# Patient Record
Sex: Female | Born: 1990
Health system: Southern US, Community
[De-identification: ages and names within clinical notes are randomized; demographics above are authoritative.]

## PROBLEM LIST (undated history)

## (undated) ENCOUNTER — Inpatient Hospital Stay: Payer: Self-pay

## (undated) DIAGNOSIS — O039 Complete or unspecified spontaneous abortion without complication: Secondary | ICD-10-CM

## (undated) DIAGNOSIS — I10 Essential (primary) hypertension: Secondary | ICD-10-CM

## (undated) DIAGNOSIS — D573 Sickle-cell trait: Secondary | ICD-10-CM

## (undated) HISTORY — DX: Sickle-cell trait: D57.3

## (undated) HISTORY — PX: FINGER SURGERY: SHX640

---
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
## 2009-05-12 ENCOUNTER — Ambulatory Visit: Payer: Self-pay | Admitting: Internal Medicine

## 2009-05-25 ENCOUNTER — Ambulatory Visit: Payer: Self-pay | Admitting: Orthopedic Surgery

## 2009-05-26 ENCOUNTER — Ambulatory Visit: Payer: Self-pay | Admitting: Orthopedic Surgery

## 2010-08-10 LAB — HM PAP SMEAR: HM Pap smear: NEGATIVE

## 2010-11-01 ENCOUNTER — Emergency Department: Payer: Self-pay | Admitting: Unknown Physician Specialty

## 2010-11-04 ENCOUNTER — Emergency Department: Payer: Self-pay | Admitting: Emergency Medicine

## 2011-08-07 ENCOUNTER — Emergency Department: Payer: Self-pay | Admitting: Emergency Medicine

## 2011-08-07 LAB — URINALYSIS, COMPLETE
Bilirubin,UR: NEGATIVE
Blood: NEGATIVE
Glucose,UR: NEGATIVE mg/dL (ref 0–75)
Ketone: NEGATIVE
Nitrite: NEGATIVE
Ph: 6 (ref 4.5–8.0)
Protein: NEGATIVE
RBC,UR: 1 /HPF (ref 0–5)
Specific Gravity: 1.019 (ref 1.003–1.030)
Squamous Epithelial: 4
WBC UR: 9 /HPF (ref 0–5)

## 2011-08-07 LAB — PREGNANCY, URINE: Pregnancy Test, Urine: NEGATIVE m[IU]/mL

## 2011-08-30 ENCOUNTER — Ambulatory Visit: Payer: Self-pay

## 2011-08-30 LAB — URINALYSIS, COMPLETE
Bilirubin,UR: NEGATIVE
Blood: NEGATIVE
Nitrite: POSITIVE
Ph: 5 (ref 4.5–8.0)

## 2011-08-30 LAB — OCCULT BLOOD X 1 CARD TO LAB, STOOL: Occult Blood, Feces: NEGATIVE

## 2011-08-30 LAB — PREGNANCY, URINE: Pregnancy Test, Urine: NEGATIVE m[IU]/mL

## 2013-07-24 ENCOUNTER — Emergency Department: Payer: Self-pay | Admitting: Emergency Medicine

## 2013-07-24 LAB — COMPREHENSIVE METABOLIC PANEL
ALBUMIN: 3.5 g/dL (ref 3.4–5.0)
ALT: 28 U/L (ref 12–78)
ANION GAP: 4 — AB (ref 7–16)
Alkaline Phosphatase: 80 U/L
BUN: 9 mg/dL (ref 7–18)
Bilirubin,Total: 0.3 mg/dL (ref 0.2–1.0)
CO2: 27 mmol/L (ref 21–32)
Calcium, Total: 8.5 mg/dL (ref 8.5–10.1)
Chloride: 107 mmol/L (ref 98–107)
Creatinine: 0.98 mg/dL (ref 0.60–1.30)
EGFR (African American): >60
EGFR (Non-African Amer.): >60
GLUCOSE: 82 mg/dL (ref 65–99)
OSMOLALITY: 273 (ref 275–301)
POTASSIUM: 3.7 mmol/L (ref 3.5–5.1)
SGOT(AST): 23 U/L (ref 15–37)
SODIUM: 138 mmol/L (ref 136–145)
TOTAL PROTEIN: 7.9 g/dL (ref 6.4–8.2)

## 2013-07-24 LAB — URINALYSIS, COMPLETE
BILIRUBIN, UR: NEGATIVE
Bacteria: NONE SEEN
Blood: NEGATIVE
Glucose,UR: NEGATIVE mg/dL (ref 0–75)
KETONE: NEGATIVE
Nitrite: NEGATIVE
PH: 6 (ref 4.5–8.0)
Protein: NEGATIVE
RBC,UR: 2 /HPF (ref 0–5)
Specific Gravity: 1.015 (ref 1.003–1.030)
Squamous Epithelial: 8
WBC UR: 30 /HPF (ref 0–5)

## 2013-07-24 LAB — CBC WITH DIFFERENTIAL/PLATELET
BASOS PCT: 0.9 %
Basophil #: 0.1 10*3/uL (ref 0.0–0.1)
EOS PCT: 3.3 %
Eosinophil #: 0.3 10*3/uL (ref 0.0–0.7)
HCT: 38.5 % (ref 35.0–47.0)
HGB: 12.4 g/dL (ref 12.0–16.0)
LYMPHS ABS: 2.6 10*3/uL (ref 1.0–3.6)
LYMPHS PCT: 33.8 %
MCH: 25.3 pg — ABNORMAL LOW (ref 26.0–34.0)
MCHC: 32.2 g/dL (ref 32.0–36.0)
MCV: 79 fL — AB (ref 80–100)
MONO ABS: 0.8 x10 3/mm (ref 0.2–0.9)
Monocyte %: 10.6 %
NEUTROS PCT: 51.4 %
Neutrophil #: 4 10*3/uL (ref 1.4–6.5)
PLATELETS: 301 10*3/uL (ref 150–440)
RBC: 4.9 10*6/uL (ref 3.80–5.20)
RDW: 14.7 % — ABNORMAL HIGH (ref 11.5–14.5)
WBC: 7.8 10*3/uL (ref 3.6–11.0)

## 2013-07-24 LAB — PREGNANCY, URINE: PREGNANCY TEST, URINE: NEGATIVE m[IU]/mL

## 2014-05-30 ENCOUNTER — Emergency Department: Payer: Self-pay | Admitting: Emergency Medicine

## 2016-02-15 ENCOUNTER — Encounter: Payer: Self-pay | Admitting: Emergency Medicine

## 2016-02-15 DIAGNOSIS — H579 Unspecified disorder of eye and adnexa: Secondary | ICD-10-CM | POA: Insufficient documentation

## 2016-02-15 DIAGNOSIS — Z5321 Procedure and treatment not carried out due to patient leaving prior to being seen by health care provider: Secondary | ICD-10-CM | POA: Insufficient documentation

## 2016-02-15 NOTE — ED Notes (Signed)
Visual acuity 20/40 left eye and 20/25 right eye

## 2016-02-15 NOTE — ED Triage Notes (Signed)
Patient ambulatory to triage with steady gait, without difficulty or distress noted; pt reports awoke with left eye irritation

## 2016-02-16 ENCOUNTER — Emergency Department
Admission: EM | Admit: 2016-02-16 | Discharge: 2016-02-16 | Disposition: A | Discharge status: Home / Self Care | Payer: Medicaid Other | Attending: Emergency Medicine | Admitting: Emergency Medicine

## 2016-05-29 ENCOUNTER — Encounter: Payer: Self-pay | Admitting: Emergency Medicine

## 2016-05-29 ENCOUNTER — Emergency Department: Payer: Medicaid Other

## 2016-05-29 ENCOUNTER — Emergency Department
Admission: EM | Admit: 2016-05-29 | Discharge: 2016-05-29 | Disposition: A | Discharge status: Home / Self Care | Payer: Medicaid Other | Attending: Emergency Medicine | Admitting: Emergency Medicine

## 2016-05-29 DIAGNOSIS — R102 Pelvic and perineal pain: Secondary | ICD-10-CM | POA: Diagnosis present

## 2016-05-29 DIAGNOSIS — B3731 Acute candidiasis of vulva and vagina: Secondary | ICD-10-CM

## 2016-05-29 DIAGNOSIS — B373 Candidiasis of vulva and vagina: Secondary | ICD-10-CM | POA: Insufficient documentation

## 2016-05-29 DIAGNOSIS — R1032 Left lower quadrant pain: Secondary | ICD-10-CM | POA: Diagnosis not present

## 2016-05-29 LAB — CBC
HCT: 39.6 % (ref 35.0–47.0)
HEMOGLOBIN: 12.6 g/dL (ref 12.0–16.0)
MCH: 25 pg — AB (ref 26.0–34.0)
MCHC: 31.9 g/dL — ABNORMAL LOW (ref 32.0–36.0)
MCV: 78.4 fL — ABNORMAL LOW (ref 80.0–100.0)
PLATELETS: 266 10*3/uL (ref 150–440)
RBC: 5.05 MIL/uL (ref 3.80–5.20)
RDW: 15.6 % — ABNORMAL HIGH (ref 11.5–14.5)
WBC: 8.9 10*3/uL (ref 3.6–11.0)

## 2016-05-29 LAB — URINALYSIS COMPLETE WITH MICROSCOPIC (ARMC ONLY)
Bilirubin Urine: NEGATIVE
Glucose, UA: NEGATIVE mg/dL
HGB URINE DIPSTICK: NEGATIVE
Ketones, ur: NEGATIVE mg/dL
LEUKOCYTES UA: NEGATIVE
Nitrite: NEGATIVE
PH: 5 (ref 5.0–8.0)
PROTEIN: NEGATIVE mg/dL
Specific Gravity, Urine: 1.023 (ref 1.005–1.030)

## 2016-05-29 LAB — POCT PREGNANCY, URINE: PREG TEST UR: NEGATIVE

## 2016-05-29 LAB — BASIC METABOLIC PANEL
ANION GAP: 5 (ref 5–15)
BUN: 12 mg/dL (ref 6–20)
CALCIUM: 8.8 mg/dL — AB (ref 8.9–10.3)
CO2: 24 mmol/L (ref 22–32)
CREATININE: 0.82 mg/dL (ref 0.44–1.00)
Chloride: 107 mmol/L (ref 101–111)
Glucose, Bld: 93 mg/dL (ref 65–99)
Potassium: 3.5 mmol/L (ref 3.5–5.1)
SODIUM: 136 mmol/L (ref 135–145)

## 2016-05-29 LAB — WET PREP, GENITAL
CLUE CELLS WET PREP: NONE SEEN
SPERM: NONE SEEN
TRICH WET PREP: NONE SEEN

## 2016-05-29 LAB — CHLAMYDIA/NGC RT PCR (ARMC ONLY)
Chlamydia Tr: NOT DETECTED
N GONORRHOEAE: NOT DETECTED

## 2016-05-29 MED ORDER — FLUCONAZOLE 150 MG PO TABS: 150.0000 mg | ORAL_TABLET | Freq: Once | ORAL | 0 refills | Status: AC

## 2016-05-29 MED ORDER — FLUCONAZOLE 100 MG PO TABS
150.0000 mg | ORAL_TABLET | Freq: Once | ORAL | Status: AC
  Administered 2016-05-29: 150 mg via ORAL
  Filled 2016-05-29: qty 1

## 2016-05-29 MED ORDER — KETOROLAC TROMETHAMINE 60 MG/2ML IM SOLN
60.0000 mg | Freq: Once | INTRAMUSCULAR | Status: AC
  Administered 2016-05-29: 60 mg via INTRAMUSCULAR
  Filled 2016-05-29: qty 2

## 2016-05-29 NOTE — ED Notes (Signed)
Pt had yearly well woman exam February 2017, exam reported to be normal.  Pt denies possibility of being pregnant, but does state that she is currently sexually active and denies being on birth control at this time.

## 2016-05-29 NOTE — ED Notes (Signed)
Pt back from US; waiting US results and MD disposition; no complaints or requests at this time

## 2016-05-29 NOTE — ED Provider Notes (Signed)
[facility] Emergency Department Provider Note   ____________________________________________   First MD Initiated Contact with Patient 05/29/16 [phone]     (approximate)  I have reviewed the triage vital signs and the nursing notes.   HISTORY  Chief Complaint Pelvic Pain    HPI Alexis Elliott is a 25 y.o. female who comes into the hospital today with some bad pelvic pain. She reports that it spread vaginal pain as well as lower abdominal pain. The pain started on Thursday and she reports that is starting to move over to her left side. She has been drinking water and cranberry juice and using different soaps to wash her self. She reports though that she has been getting more irritated with soap recently. She reports that she has not taken any medication. She has some vaginal discharge which is screaming color and sticky. The patient denies an odor. She has never had this occur before. She reports that she does use protection when she sexually active. The patient rates her pain as 6-7 out of 10 in intensity. The patient reports it is also worse when she is up and active. She vomited once earlier this week and has had some nausea. The patient is here for evaluation.   History reviewed. No pertinent past medical history.  There are no active problems to display for this patient.   Past Surgical History:  Procedure Laterality Date  . FINGER SURGERY      Prior to Admission medications   Medication Sig Start Date End Date Taking? Authorizing Provider  fluconazole (DIFLUCAN) 150 MG tablet Take 1 tablet (150 mg total) by mouth once. 06/05/16 06/05/16  Rebecka ApleyAllison P Pasha Broad, MD    Allergies Patient has no known allergies.  No family history on file.  Social History Social History  Substance Use Topics  . Smoking status: Never Smoker  . Smokeless tobacco: Never Used  . Alcohol use No    Review of Systems Constitutional: No fever/chills Eyes: No visual  changes. ENT: No sore throat. Cardiovascular: Denies chest pain. Respiratory: Denies shortness of breath. Gastrointestinal:  abdominal pain.   nausea,  vomiting.  No diarrhea.  No constipation. Genitourinary: Vaginal discharge Musculoskeletal: Negative for back pain. Skin: Negative for rash. Neurological: Negative for headaches, focal weakness or numbness.  10-point ROS otherwise negative.  ____________________________________________   PHYSICAL EXAM:  VITAL SIGNS: ED Triage Vitals  Enc Vitals Group     BP 05/29/16 0154 140/77     Pulse Rate 05/29/16 0154 98     Resp 05/29/16 0154 16     Temp 05/29/16 0154 98.3 F (36.8 C)     Temp Source 05/29/16 0154 Oral     SpO2 05/29/16 0154 100 %     Weight 05/29/16 0155 164 lb (74.4 kg)     Height 05/29/16 0155 5\' 2"  (1.575 m)     Head Circumference --      Peak Flow --      Pain Score 05/29/16 0155 6     Pain Loc --      Pain Edu? --      Excl. in GC? --     Constitutional: Alert and oriented. Well appearing and in mild distress. Eyes: Conjunctivae are normal. PERRL. EOMI. Head: Atraumatic. Nose: No congestion/rhinnorhea. Mouth/Throat: Mucous membranes are moist.  Oropharynx non-erythematous. Cardiovascular: Normal rate, regular rhythm. Grossly normal heart sounds.  Good peripheral circulation. Respiratory: Normal respiratory effort.  No retractions. Lungs CTAB. Gastrointestinal: Soft and nontender. No distention.  Active bowel sounds {**Genitourinary:  Musculoskeletal: No lower extremity tenderness nor edema.   Neurologic:  Normal speech and language.  Skin:  Skin is warm, dry and intact.  Psychiatric: Mood and affect are normal.   ____________________________________________   LABS (all labs ordered are listed, but only abnormal results are displayed)  Labs Reviewed  WET PREP, GENITAL - Abnormal; Notable for the following:       Result Value   Yeast Wet Prep HPF POC PRESENT (*)    WBC, Wet Prep HPF POC MODERATE (*)     All other components within normal limits  URINALYSIS COMPLETEWITH MICROSCOPIC ([facility] ONLY) - Abnormal; Notable for the following:    Color, Urine YELLOW (*)    APPearance CLEAR (*)    Bacteria, UA RARE (*)    Squamous Epithelial / LPF 0-5 (*)    All other components within normal limits  BASIC METABOLIC PANEL - Abnormal; Notable for the following:    Calcium 8.8 (*)    All other components within normal limits  CBC - Abnormal; Notable for the following:    MCV 78.4 (*)    MCH 25.0 (*)    MCHC 31.9 (*)    RDW 15.6 (*)    All other components within normal limits  CHLAMYDIA/NGC RT PCR ([facility] ONLY)  POC URINE PREG, ED  POCT PREGNANCY, URINE   ____________________________________________  EKG  none ____________________________________________  RADIOLOGY  Pelvic US ____________________________________________   PROCEDURES  Procedure(s) performed: None  Procedures  Critical Care performed: No  ____________________________________________   INITIAL IMPRESSION / ASSESSMENT AND PLAN / ED COURSE  Pertinent labs & imaging results that were available during my care of the patient were reviewed by me and considered in my medical decision making (see chart for details).  This is a 25 year old female who comes into the hospital today with some pelvic pain. She is also having some vaginal discharge. I will send the patient for an ultrasound and his blood work. I will then reassess the patient.  Clinical Course as of May 30 739  [facility]  0606 No significant abnormality of the uterus or ovaries. Small volume free pelvic fluid   US Pelvis Complete [AW]    Clinical Course User Index [AW] Rebecka ApleyAllison P Keiri Solano, MD   The patient's US is negative. She did receive diflucan for her yeast infection. The patient also received toradol for her pain. She will be discharged to home to follow up with OB/ GYN  ____________________________________________   FINAL  CLINICAL IMPRESSION(S) / ED DIAGNOSES  Final diagnoses:  Pelvic pain  Pelvic pain in female  Left lower quadrant pain  Yeast vaginitis      NEW MEDICATIONS STARTED DURING THIS VISIT:  Discharge Medication List as of 05/29/2016  6:10 AM    START taking these medications   Details  fluconazole (DIFLUCAN) 150 MG tablet Take 1 tablet (150 mg total) by mouth once., Starting Mon 06/05/2016, Print         Note:  This document was prepared using Dragon voice recognition software and may include unintentional dictation errors.    Rebecka ApleyAllison P Blakley Michna, MD 05/29/16 [id number]

## 2016-05-29 NOTE — ED Notes (Signed)
Patient transported to Ultrasound 

## 2016-05-29 NOTE — ED Triage Notes (Signed)
Pt states that she has been having pelvic pain with nausea and pain that radiates to her llq, pt also reports some more discharge than normal, denies pain with urination

## 2016-07-31 ENCOUNTER — Emergency Department
Admission: EM | Admit: 2016-07-31 | Discharge: 2016-07-31 | Disposition: A | Discharge status: Home / Self Care | Payer: Medicaid Other | Attending: Emergency Medicine | Admitting: Emergency Medicine

## 2016-07-31 DIAGNOSIS — R51 Headache: Secondary | ICD-10-CM | POA: Diagnosis present

## 2016-07-31 DIAGNOSIS — B349 Viral infection, unspecified: Secondary | ICD-10-CM | POA: Diagnosis not present

## 2016-07-31 LAB — INFLUENZA PANEL BY PCR (TYPE A & B)
INFLBPCR: NEGATIVE
Influenza A By PCR: NEGATIVE

## 2016-07-31 MED ORDER — ONDANSETRON 4 MG PO TBDP: 4.0000 mg | ORAL_TABLET | Freq: Three times a day (TID) | ORAL | 0 refills | Status: DC | PRN

## 2016-07-31 MED ORDER — PSEUDOEPH-BROMPHEN-DM 30-2-10 MG/5ML PO SYRP: 10.0000 mL | ORAL_SOLUTION | Freq: Four times a day (QID) | ORAL | 0 refills | Status: DC | PRN

## 2016-07-31 NOTE — ED Provider Notes (Signed)
[facility] Emergency Department Provider Note  ____________________________________________  Time seen: Approximately 10:27 PM  I have reviewed the triage vital signs and the nursing notes.   HISTORY  Chief Complaint Headache; Generalized Body Aches; and Emesis    HPI Alexis Elliott is a 26 y.o. female who presents emergency department complaining of multiple complaints including intermittent low-grade headache, nasal congestion, sore throat, coughing, nausea,one episode of emesis, 3 episodes of diarrhea over the last 3 days, and myalgias. Patient has not tried any medications for this complaint prior to arrival. Patient reports that the headache waxes and wanes and will totally resolved. Patient is also had one episode of emesis with no return. She has had one episode of diarrhea per day over the last 3 days. She also endorses some nasal congestion, sore throat, coughing. No other complaints.   History reviewed. No pertinent past medical history.  There are no active problems to display for this patient.   Past Surgical History:  Procedure Laterality Date  . FINGER SURGERY      Prior to Admission medications   Medication Sig Start Date End Date Taking? Authorizing Provider  brompheniramine-pseudoephedrine-DM 30-2-10 MG/5ML syrup Take 10 mLs by mouth 4 (four) times daily as needed. 07/31/16   Delorise Royals Solstice Lastinger, PA-C  ondansetron (ZOFRAN-ODT) 4 MG disintegrating tablet Take 1 tablet (4 mg total) by mouth every 8 (eight) hours as needed for nausea or vomiting. 07/31/16   Delorise Royals Davanta Meuser, PA-C    Allergies Patient has no known allergies.  No family history on file.  Social History Social History  Substance Use Topics  . Smoking status: Never Smoker  . Smokeless tobacco: Never Used  . Alcohol use No     Review of Systems  Constitutional: No fever/chills Eyes: No visual changes. No discharge ENT: Nasal congestion and sore  throat. Cardiovascular: no chest pain. Respiratory: Positive cough. No SOB. Gastrointestinal: No abdominal pain.  Nausea for nausea and one episode of emesis.Marland Kitchen  3 episodes of diarrhea.  No constipation. Musculoskeletal: Negative for musculoskeletal pain. Skin: Negative for rash, abrasions, lacerations, ecchymosis. Neurological: Negative for headaches, focal weakness or numbness. 10-point ROS otherwise negative.  ____________________________________________   PHYSICAL EXAM:  VITAL SIGNS: ED Triage Vitals  Enc Vitals Group     BP 07/31/16 2122 127/78     Pulse Rate 07/31/16 2122 (!) 55     Resp 07/31/16 2122 16     Temp 07/31/16 2122 98 F (36.7 C)     Temp Source 07/31/16 2122 Oral     SpO2 07/31/16 2122 99 %     Weight 07/31/16 2123 162 lb 8 oz (73.7 kg)     Height 07/31/16 2123 5\' 2"  (1.575 m)     Head Circumference --      Peak Flow --      Pain Score 07/31/16 2123 7     Pain Loc --      Pain Edu? --      Excl. in GC? --      Constitutional: Alert and oriented. Well appearing and in no acute distress. Eyes: Conjunctivae are normal. PERRL. EOMI. Head: Atraumatic. ENT:      Ears: EACs and TMs unremarkable bilaterally.      Nose: Moderate clear congestion/rhinnorhea.      Mouth/Throat: Mucous membranes are moist. Oropharynx is mildly erythematous but nonedematous. Uvula is midline. Tonsils are unremarkable bilaterally. Neck: No stridor. Supple full range of motion Hematological/Lymphatic/Immunilogical: No cervical lymphadenopathy. Cardiovascular: Normal rate, regular  rhythm. Normal S1 and S2.  Good peripheral circulation. Respiratory: Normal respiratory effort without tachypnea or retractions. Lungs CTAB. Good air entry to the bases with no decreased or absent breath sounds. Gastrointestinal: Bowel sounds 4 quadrants. Soft and nontender to palpation. No guarding or rigidity. No palpable masses. No distention.  Musculoskeletal: Full range of motion to all extremities. No  gross deformities appreciated. Neurologic:  Normal speech and language. No gross focal neurologic deficits are appreciated.  Skin:  Skin is warm, dry and intact. No rash noted. Psychiatric: Mood and affect are normal. Speech and behavior are normal. Patient exhibits appropriate insight and judgement.   ____________________________________________   LABS (all labs ordered are listed, but only abnormal results are displayed)  Labs Reviewed  INFLUENZA PANEL BY PCR (TYPE A & B)   ____________________________________________  EKG   ____________________________________________  RADIOLOGY   No results found.  ____________________________________________    PROCEDURES  Procedure(s) performed:    Procedures    Medications - No data to display   ____________________________________________   INITIAL IMPRESSION / ASSESSMENT AND PLAN / ED COURSE  Pertinent labs & imaging results that were available during my care of the patient were reviewed by me and considered in my medical decision making (see chart for details).  Review of the [place] CSRS was performed in accordance of the [facility] prior to dispensing any controlled drugs.  Clinical Course     Patient's diagnosis is consistent with Viral illness. Negative flu swabs emergency department. Exam is reassuring with no acute findings. No imaging or labs or do necessary at this time.. Patient will be discharged home with prescriptions for symptom control medications. Patient is to follow up with primary care as needed or otherwise directed. Patient is given ED precautions to return to the ED for any worsening or new symptoms.     ____________________________________________  FINAL CLINICAL IMPRESSION(S) / ED DIAGNOSES  Final diagnoses:  Viral illness      NEW MEDICATIONS STARTED DURING THIS VISIT:  New Prescriptions   BROMPHENIRAMINE-PSEUDOEPHEDRINE-DM 30-2-10 MG/5ML SYRUP    Take 10 mLs by mouth 4 (four) times daily  as needed.   ONDANSETRON (ZOFRAN-ODT) 4 MG DISINTEGRATING TABLET    Take 1 tablet (4 mg total) by mouth every 8 (eight) hours as needed for nausea or vomiting.        This chart was dictated using voice recognition software/Dragon. Despite best efforts to proofread, errors can occur which can change the meaning. Any change was purely unintentional.    Racheal PatchesJonathan D Verline Kong, PA-C 07/31/16 2232    Myrna Blazeravid Matthew Schaevitz, MD 08/01/16 Moses [id number]

## 2016-07-31 NOTE — ED Triage Notes (Signed)
Pt presents to ED with c/o flu-like s/x's x3 days. Pt reports (1) episode of emesis yesterday, generalized body aches, and 3 episodes of diarrhea in the last 3 days. Pt denies cough, reports (+) congestion, denies shortness of breath. Pt reports frontal HA x 3 days.

## 2016-07-31 NOTE — ED Notes (Signed)
Upon assessment pt reports intermittent headache for last 3 days and body aches.

## 2016-08-05 ENCOUNTER — Emergency Department
Admission: EM | Admit: 2016-08-05 | Discharge: 2016-08-05 | Disposition: A | Discharge status: Home / Self Care | Payer: Medicaid Other | Attending: Emergency Medicine | Admitting: Emergency Medicine

## 2016-08-05 ENCOUNTER — Encounter: Payer: Self-pay | Admitting: Emergency Medicine

## 2016-08-05 DIAGNOSIS — Y999 Unspecified external cause status: Secondary | ICD-10-CM | POA: Insufficient documentation

## 2016-08-05 DIAGNOSIS — Y929 Unspecified place or not applicable: Secondary | ICD-10-CM | POA: Diagnosis not present

## 2016-08-05 DIAGNOSIS — S3992XA Unspecified injury of lower back, initial encounter: Secondary | ICD-10-CM | POA: Diagnosis present

## 2016-08-05 DIAGNOSIS — M549 Dorsalgia, unspecified: Secondary | ICD-10-CM | POA: Insufficient documentation

## 2016-08-05 DIAGNOSIS — W01198A Fall on same level from slipping, tripping and stumbling with subsequent striking against other object, initial encounter: Secondary | ICD-10-CM | POA: Insufficient documentation

## 2016-08-05 DIAGNOSIS — Y939 Activity, unspecified: Secondary | ICD-10-CM | POA: Insufficient documentation

## 2016-08-05 NOTE — ED Triage Notes (Signed)
Pt states that she slipped on ice Friday and hit her lower back on cement. Pt is ambulatory to triage with NAD noted at this time.

## 2016-08-13 ENCOUNTER — Encounter: Payer: Self-pay | Admitting: Emergency Medicine

## 2016-08-13 ENCOUNTER — Emergency Department
Admission: EM | Admit: 2016-08-13 | Discharge: 2016-08-13 | Disposition: A | Discharge status: Home / Self Care | Payer: Medicaid Other | Attending: Student in an Organized Health Care Education/Training Program | Admitting: Student in an Organized Health Care Education/Training Program

## 2016-08-13 DIAGNOSIS — W009XXA Unspecified fall due to ice and snow, initial encounter: Secondary | ICD-10-CM | POA: Insufficient documentation

## 2016-08-13 DIAGNOSIS — S3992XA Unspecified injury of lower back, initial encounter: Secondary | ICD-10-CM | POA: Diagnosis present

## 2016-08-13 DIAGNOSIS — N390 Urinary tract infection, site not specified: Secondary | ICD-10-CM | POA: Diagnosis not present

## 2016-08-13 DIAGNOSIS — S39012A Strain of muscle, fascia and tendon of lower back, initial encounter: Secondary | ICD-10-CM

## 2016-08-13 DIAGNOSIS — Y999 Unspecified external cause status: Secondary | ICD-10-CM | POA: Diagnosis not present

## 2016-08-13 DIAGNOSIS — Y939 Activity, unspecified: Secondary | ICD-10-CM | POA: Insufficient documentation

## 2016-08-13 DIAGNOSIS — Y929 Unspecified place or not applicable: Secondary | ICD-10-CM | POA: Diagnosis not present

## 2016-08-13 LAB — URINALYSIS, COMPLETE (UACMP) WITH MICROSCOPIC
Bilirubin Urine: NEGATIVE
Glucose, UA: NEGATIVE mg/dL
Ketones, ur: NEGATIVE mg/dL
Nitrite: NEGATIVE
PROTEIN: 100 mg/dL — AB
Specific Gravity, Urine: 1.033 — ABNORMAL HIGH (ref 1.005–1.030)
pH: 6 (ref 5.0–8.0)

## 2016-08-13 MED ORDER — MELOXICAM 15 MG PO TABS: 15.0000 mg | ORAL_TABLET | Freq: Every day | ORAL | 0 refills | Status: DC

## 2016-08-13 MED ORDER — MELOXICAM 7.5 MG PO TABS
15.0000 mg | ORAL_TABLET | Freq: Once | ORAL | Status: AC
  Administered 2016-08-13: 15 mg via ORAL
  Filled 2016-08-13: qty 2

## 2016-08-13 MED ORDER — PHENAZOPYRIDINE HCL 95 MG PO TABS: 95.0000 mg | ORAL_TABLET | Freq: Three times a day (TID) | ORAL | 0 refills | Status: DC | PRN

## 2016-08-13 MED ORDER — CEPHALEXIN 500 MG PO CAPS: 500.0000 mg | ORAL_CAPSULE | Freq: Four times a day (QID) | ORAL | 0 refills | Status: DC

## 2016-08-13 MED ORDER — CYCLOBENZAPRINE HCL 10 MG PO TABS: 10.0000 mg | ORAL_TABLET | Freq: Three times a day (TID) | ORAL | 0 refills | Status: DC | PRN

## 2016-08-13 MED ORDER — CEPHALEXIN 500 MG PO CAPS
500.0000 mg | ORAL_CAPSULE | Freq: Once | ORAL | Status: AC
Start: 2016-08-13 — End: 2016-08-13
  Administered 2016-08-13: 500 mg via ORAL
  Filled 2016-08-13: qty 1

## 2016-08-13 NOTE — ED Notes (Addendum)
Pt c/o left lower back pain that radiates into lower abd - pain with urination - denies frequency with urination - pt given urine sample cup - at this time she is unable to void

## 2016-08-13 NOTE — ED Notes (Signed)
Pt ambulatory to stat registration with no difficulty noted; c/o back pain x 2 weeks and abd pain; patient states "I can't hardly stand up"; after registering pt went to vending machine for a drink; spoke with pt and she understands due to presenting complaint of abd pain we ask that she not eat or drink until seen by MD

## 2016-08-13 NOTE — ED Triage Notes (Signed)
Patient states that she developed left lower back pain about 2 weeks ago. Patient states that today she started having pain with urination.

## 2016-08-13 NOTE — ED Provider Notes (Signed)
[facility] Emergency Department Provider Note  ____________________________________________  Time seen: Approximately 10:26 PM  I have reviewed the triage vital signs and the nursing notes.   HISTORY  Chief Complaint Back Pain    HPI Alexis Elliott is a 26 y.o. female who presents emergency department complaining of lower back pain. Patient states that approximately 2 weeks ago she fell in the snow and has had residual back pain since this event. Patient states that she's been taking Tylenol and Motrin which has helped some but is not fully alleviated her symptoms. Today, patient states that she is started to notice some dysuria and polyuria. She denies any hematuria. Patient denies any abdominal pain, nausea or vomiting, fevers or chills. Patient denies any radicular symptoms from lower back pain. No bowel or by suction, saddle anesthesia, paresthesias.   History reviewed. No pertinent past medical history.  There are no active problems to display for this patient.   Past Surgical History:  Procedure Laterality Date  . FINGER SURGERY      Prior to Admission medications   Medication Sig Start Date End Date Taking? Authorizing Provider  brompheniramine-pseudoephedrine-DM 30-2-10 MG/5ML syrup Take 10 mLs by mouth 4 (four) times daily as needed. 07/31/16   Delorise Royals Cuthriell, PA-C  cephALEXin (KEFLEX) 500 MG capsule Take 1 capsule (500 mg total) by mouth 4 (four) times daily. 08/13/16   Delorise Royals Cuthriell, PA-C  cyclobenzaprine (FLEXERIL) 10 MG tablet Take 1 tablet (10 mg total) by mouth 3 (three) times daily as needed for muscle spasms. 08/13/16   Delorise Royals Cuthriell, PA-C  meloxicam (MOBIC) 15 MG tablet Take 1 tablet (15 mg total) by mouth daily. 08/13/16   Delorise Royals Cuthriell, PA-C  ondansetron (ZOFRAN-ODT) 4 MG disintegrating tablet Take 1 tablet (4 mg total) by mouth every 8 (eight) hours as needed for nausea or vomiting. 07/31/16   Delorise Royals  Cuthriell, PA-C  phenazopyridine (PYRIDIUM) 95 MG tablet Take 1 tablet (95 mg total) by mouth 3 (three) times daily as needed for pain. 08/13/16   Delorise Royals Cuthriell, PA-C    Allergies Patient has no known allergies.  No family history on file.  Social History Social History  Substance Use Topics  . Smoking status: Never Smoker  . Smokeless tobacco: Never Used  . Alcohol use No     Review of Systems  Constitutional: No fever/chills Cardiovascular: no chest pain. Respiratory: no cough. No SOB. Gastrointestinal: No abdominal pain.  No nausea, no vomiting.  No diarrhea.  No constipation. Genitourinary: As noted for dysuria and polyuria. No hematuria Musculoskeletal: Positive for right lower back pain Skin: Negative for rash, abrasions, lacerations, ecchymosis. Neurological: Negative for headaches, focal weakness or numbness. 10-point ROS otherwise negative.  ____________________________________________   PHYSICAL EXAM:  VITAL SIGNS: ED Triage Vitals  Enc Vitals Group     BP 08/13/16 2043 136/72     Pulse Rate 08/13/16 2043 (!) 112     Resp 08/13/16 2043 18     Temp 08/13/16 2043 98.1 F (36.7 C)     Temp Source 08/13/16 2043 Oral     SpO2 08/13/16 2043 97 %     Weight 08/13/16 2045 175 lb (79.4 kg)     Height 08/13/16 2045 5\' 2"  (1.575 m)     Head Circumference --      Peak Flow --      Pain Score 08/13/16 2045 10     Pain Loc --      Pain  Edu? --      Excl. in GC? --      Constitutional: Alert and oriented. Well appearing and in no acute distress. Eyes: Conjunctivae are normal. PERRL. EOMI. Head: Atraumatic. Neck: No stridor.   Cardiovascular: Normal rate, regular rhythm. Normal S1 and S2.  Good peripheral circulation. Respiratory: Normal respiratory effort without tachypnea or retractions. Lungs CTAB. Good air entry to the bases with no decreased or absent breath sounds. Gastrointestinal: Bowel sounds 4 quadrants. Soft and nontender to palpation. No  guarding or rigidity. No palpable masses. No distention. No CVA tenderness. Musculoskeletal: Full range of motion to all extremities. No gross deformities appreciated. No visible deformities on inspection. No tenderness to palpation of lumbar spine or paraspinal muscle groups. Patient is tender to palpation over sciatic notch. Dorsalis pedis pulse intact bilateral lower shin release. Sensation intact bilateral lower studies. Neurologic:  Normal speech and language. No gross focal neurologic deficits are appreciated.  Skin:  Skin is warm, dry and intact. No rash noted. Psychiatric: Mood and affect are normal. Speech and behavior are normal. Patient exhibits appropriate insight and judgement.   ____________________________________________   LABS (all labs ordered are listed, but only abnormal results are displayed)  Labs Reviewed  URINALYSIS, COMPLETE (UACMP) WITH MICROSCOPIC - Abnormal; Notable for the following:       Result Value   Color, Urine YELLOW (*)    APPearance CLOUDY (*)    Specific Gravity, Urine 1.033 (*)    Hgb urine dipstick SMALL (*)    Protein, ur 100 (*)    Leukocytes, UA MODERATE (*)    Bacteria, UA RARE (*)    Squamous Epithelial / LPF TOO NUMEROUS TO COUNT (*)    All other components within normal limits   ____________________________________________  EKG   ____________________________________________  RADIOLOGY   No results found.  ____________________________________________    PROCEDURES  Procedure(s) performed:    Procedures    Medications  meloxicam (MOBIC) tablet 15 mg (15 mg Oral Given 08/13/16 2248)  cephALEXin (KEFLEX) capsule 500 mg (500 mg Oral Given 08/13/16 2248)     ____________________________________________   INITIAL IMPRESSION / ASSESSMENT AND PLAN / ED COURSE  Pertinent labs & imaging results that were available during my care of the patient were reviewed by me and considered in my medical decision making (see chart for  details).  Review of the [place] CSRS was performed in accordance of the [facility] prior to dispensing any controlled drugs.     Patient's diagnosis is consistent with 6 drained with UTI. Patient has had lower back pain for 2 weeks status post falling. No concerning symptoms requiring imaging. Patient will be treated symptomatically. Patient also was complaining of UTI like symptoms. Urinalysis returned with leukocytes. UA treated for UTI.Marland Kitchen Patient will be discharged home with prescriptions for anti-inflammatory, muscle relaxer, antibiotic, Pyridium. Patient is to follow up with primary care as needed or otherwise directed. Patient is given ED precautions to return to the ED for any worsening or new symptoms.     ____________________________________________  FINAL CLINICAL IMPRESSION(S) / ED DIAGNOSES  Final diagnoses:  Strain of lumbar region, initial encounter  Urinary tract infection without hematuria, site unspecified      NEW MEDICATIONS STARTED DURING THIS VISIT:  New Prescriptions   CEPHALEXIN (KEFLEX) 500 MG CAPSULE    Take 1 capsule (500 mg total) by mouth 4 (four) times daily.   CYCLOBENZAPRINE (FLEXERIL) 10 MG TABLET    Take 1 tablet (10 mg total) by mouth 3 (  three) times daily as needed for muscle spasms.   MELOXICAM (MOBIC) 15 MG TABLET    Take 1 tablet (15 mg total) by mouth daily.   PHENAZOPYRIDINE (PYRIDIUM) 95 MG TABLET    Take 1 tablet (95 mg total) by mouth 3 (three) times daily as needed for pain.        This chart was dictated using voice recognition software/Dragon. Despite best efforts to proofread, errors can occur which can change the meaning. Any change was purely unintentional.    Racheal PatchesJonathan D Cuthriell, PA-C 08/13/16 2250    Willy EddyPatrick Robinson, MD 08/13/16 2340

## 2016-09-20 ENCOUNTER — Ambulatory Visit
Admission: EM | Admit: 2016-09-20 | Discharge: 2016-09-20 | Disposition: A | Discharge status: Home / Self Care | Payer: Medicaid Other | Attending: Family Medicine | Admitting: Family Medicine

## 2016-09-20 DIAGNOSIS — S39012A Strain of muscle, fascia and tendon of lower back, initial encounter: Secondary | ICD-10-CM | POA: Diagnosis not present

## 2016-09-20 MED ORDER — METHOCARBAMOL 500 MG PO TABS: 500.0000 mg | ORAL_TABLET | Freq: Three times a day (TID) | ORAL | 0 refills | Status: DC | PRN

## 2016-09-20 NOTE — ED Triage Notes (Signed)
Patient complains of intermittant back pain that radiates from right lower back up through her right side upper back. Patient reports that she fell on the ice back in January and has been having pain since then. Patient states that pain started for this episode 2 days ago.

## 2016-09-20 NOTE — ED Provider Notes (Signed)
[facility]    CSN: [id number] Arrival date & time: 09/20/16  1127     History   Chief Complaint Chief Complaint  Patient presents with  . Back Pain    HPI Alexis Elliott is a 26 y.o. female.   The history is provided by the patient.  Back Pain  Location:  Lumbar spine Quality:  Aching Radiates to: right upper mid back. Pain severity:  Moderate Pain is:  Same all the time Onset quality:  Sudden Duration:  6 weeks Timing:  Intermittent Progression:  Waxing and waning Chronicity:  New Context: falling (states about 6 weeks ago she fell after slipping on ice)   Relieved by:  NSAIDs (mild relief) Associated symptoms: no abdominal pain, no abdominal swelling, no bladder incontinence, no bowel incontinence, no chest pain, no dysuria, no fever, no headaches, no leg pain, no numbness, no paresthesias, no pelvic pain, no perianal numbness, no tingling, no weakness and no weight loss   Risk factors: obesity   Risk factors: no hx of cancer, no hx of osteoporosis, no menopause, not pregnant, no recent surgery, no steroid use and no vascular disease     History reviewed. No pertinent past medical history.  There are no active problems to display for this patient.   Past Surgical History:  Procedure Laterality Date  . FINGER SURGERY      OB History    No data available       Home Medications    Prior to Admission medications   Medication Sig Start Date End Date Taking? Authorizing Provider  cyclobenzaprine (FLEXERIL) 10 MG tablet Take 1 tablet (10 mg total) by mouth 3 (three) times daily as needed for muscle spasms. 08/13/16  Yes Jonathan D Cuthriell, PA-C  brompheniramine-pseudoephedrine-DM 30-2-10 MG/5ML syrup Take 10 mLs by mouth 4 (four) times daily as needed. 07/31/16   Delorise Royals Cuthriell, PA-C  cephALEXin (KEFLEX) 500 MG capsule Take 1 capsule (500 mg total) by mouth 4 (four) times daily. 08/13/16   Delorise Royals Cuthriell, PA-C  meloxicam (MOBIC) 15 MG  tablet Take 1 tablet (15 mg total) by mouth daily. 08/13/16   Delorise Royals Cuthriell, PA-C  methocarbamol (ROBAXIN) 500 MG tablet Take 1 tablet (500 mg total) by mouth every 8 (eight) hours as needed for muscle spasms. 09/20/16   Payton Mccallum, MD  ondansetron (ZOFRAN-ODT) 4 MG disintegrating tablet Take 1 tablet (4 mg total) by mouth every 8 (eight) hours as needed for nausea or vomiting. 07/31/16   Delorise Royals Cuthriell, PA-C  phenazopyridine (PYRIDIUM) 95 MG tablet Take 1 tablet (95 mg total) by mouth 3 (three) times daily as needed for pain. 08/13/16   Delorise Royals Cuthriell, PA-C    Family History History reviewed. No pertinent family history.  Social History Social History  Substance Use Topics  . Smoking status: Never Smoker  . Smokeless tobacco: Never Used  . Alcohol use No     Allergies   Patient has no known allergies.   Review of Systems Review of Systems  Constitutional: Negative for fever and weight loss.  Cardiovascular: Negative for chest pain.  Gastrointestinal: Negative for abdominal pain and bowel incontinence.  Genitourinary: Negative for bladder incontinence, dysuria and pelvic pain.  Musculoskeletal: Positive for back pain.  Neurological: Negative for tingling, weakness, numbness, headaches and paresthesias.     Physical Exam Triage Vital Signs ED Triage Vitals  Enc Vitals Group     BP 09/20/16 1152 118/75     Pulse Rate 09/20/16  1152 84     Resp 09/20/16 1152 16     Temp 09/20/16 1152 98.4 F (36.9 C)     Temp Source 09/20/16 1152 Oral     SpO2 09/20/16 1152 100 %     Weight 09/20/16 1150 175 lb (79.4 kg)     Height 09/20/16 1150 5\' 2"  (1.575 m)     Head Circumference --      Peak Flow --      Pain Score 09/20/16 1151 7     Pain Loc --      Pain Edu? --      Excl. in GC? --    No data found.   Updated Vital Signs BP 118/75 (BP Location: Left Arm)   Pulse 84   Temp 98.4 F (36.9 C) (Oral)   Resp 16   Ht 5\' 2"  (1.575 m)   Wt 175 lb (79.4 kg)    LMP 09/02/2016   SpO2 100%   BMI 32.01 kg/m   Visual Acuity Right Eye Distance:   Left Eye Distance:   Bilateral Distance:    Right Eye Near:   Left Eye Near:    Bilateral Near:     Physical Exam  Constitutional: She appears well-developed and well-nourished. No distress.  Musculoskeletal: She exhibits tenderness. She exhibits no edema.       Lumbar back: She exhibits tenderness (over the lumbar paraspinous muscles on the right) and spasm. She exhibits normal range of motion, no bony tenderness, no swelling, no edema, no deformity, no laceration, no pain and normal pulse.  Neurological: She is alert. She has normal reflexes. She displays normal reflexes. She exhibits normal muscle tone.  Skin: Skin is warm and dry. No rash noted. She is not diaphoretic. No erythema.  Nursing note and vitals reviewed.    UC Treatments / Results  Labs (all labs ordered are listed, but only abnormal results are displayed) Labs Reviewed - No data to display  EKG  EKG Interpretation None       Radiology No results found.  Procedures Procedures (including critical care time)  Medications Ordered in UC Medications - No data to display   Initial Impression / Assessment and Plan / UC Course  I have reviewed the triage vital signs and the nursing notes.  Pertinent labs & imaging results that were available during my care of the patient were reviewed by me and considered in my medical decision making (see chart for details).       Final Clinical Impressions(s) / UC Diagnoses   Final diagnoses:  Strain of lumbar region, initial encounter    New Prescriptions Discharge Medication List as of 09/20/2016 12:05 PM    START taking these medications   Details  methocarbamol (ROBAXIN) 500 MG tablet Take 1 tablet (500 mg total) by mouth every 8 (eight) hours as needed for muscle spasms., Starting Wed 09/20/2016, Normal       1.diagnosis reviewed with patient 2. rx as per orders above;  reviewed possible side effects, interactions, risks and benefits  3. Recommend supportive treatment with heat and gentle stretching 4. Follow-up prn if symptoms worsen or don't improve   Payton Mccallumrlando Tanzania Basham, MD 09/20/16 1554

## 2016-11-10 ENCOUNTER — Emergency Department
Admission: EM | Admit: 2016-11-10 | Discharge: 2016-11-10 | Disposition: A | Discharge status: Home / Self Care | Payer: Medicaid Other | Attending: Emergency Medicine | Admitting: Emergency Medicine

## 2016-11-10 ENCOUNTER — Encounter: Payer: Self-pay | Admitting: Emergency Medicine

## 2016-11-10 DIAGNOSIS — R1031 Right lower quadrant pain: Secondary | ICD-10-CM | POA: Diagnosis present

## 2016-11-10 DIAGNOSIS — Z5321 Procedure and treatment not carried out due to patient leaving prior to being seen by health care provider: Secondary | ICD-10-CM | POA: Diagnosis not present

## 2016-11-10 LAB — URINALYSIS, COMPLETE (UACMP) WITH MICROSCOPIC
BACTERIA UA: NONE SEEN
Bilirubin Urine: NEGATIVE
Glucose, UA: NEGATIVE mg/dL
HGB URINE DIPSTICK: NEGATIVE
Ketones, ur: NEGATIVE mg/dL
LEUKOCYTES UA: NEGATIVE
NITRITE: NEGATIVE
Protein, ur: NEGATIVE mg/dL
SPECIFIC GRAVITY, URINE: 1.021 (ref 1.005–1.030)
pH: 6 (ref 5.0–8.0)

## 2016-11-10 LAB — COMPREHENSIVE METABOLIC PANEL
ALBUMIN: 4 g/dL (ref 3.5–5.0)
ALK PHOS: 49 U/L (ref 38–126)
ALT: 25 U/L (ref 14–54)
AST: 26 U/L (ref 15–41)
Anion gap: 7 (ref 5–15)
BILIRUBIN TOTAL: 0.4 mg/dL (ref 0.3–1.2)
BUN: 10 mg/dL (ref 6–20)
CO2: 22 mmol/L (ref 22–32)
Calcium: 8.8 mg/dL — ABNORMAL LOW (ref 8.9–10.3)
Chloride: 109 mmol/L (ref 101–111)
Creatinine, Ser: 1.02 mg/dL — ABNORMAL HIGH (ref 0.44–1.00)
GFR calc Af Amer: >60 mL/min (ref >60)
GLUCOSE: 158 mg/dL — AB (ref 65–99)
Potassium: 3.9 mmol/L (ref 3.5–5.1)
Sodium: 138 mmol/L (ref 135–145)
TOTAL PROTEIN: 7.6 g/dL (ref 6.5–8.1)

## 2016-11-10 LAB — CBC
HEMATOCRIT: 37.9 % (ref 35.0–47.0)
Hemoglobin: 12.4 g/dL (ref 12.0–16.0)
MCH: 25.9 pg — ABNORMAL LOW (ref 26.0–34.0)
MCHC: 32.7 g/dL (ref 32.0–36.0)
MCV: 79.2 fL — ABNORMAL LOW (ref 80.0–100.0)
PLATELETS: 316 10*3/uL (ref 150–440)
RBC: 4.78 MIL/uL (ref 3.80–5.20)
RDW: 15.5 % — ABNORMAL HIGH (ref 11.5–14.5)
WBC: 10.8 10*3/uL (ref 3.6–11.0)

## 2016-11-10 LAB — POCT PREGNANCY, URINE: PREG TEST UR: NEGATIVE

## 2016-11-10 LAB — LIPASE, BLOOD: Lipase: 19 U/L (ref 11–51)

## 2016-11-10 MED ORDER — ONDANSETRON 4 MG PO TBDP
4.0000 mg | ORAL_TABLET | Freq: Once | ORAL | Status: AC | PRN
  Administered 2016-11-10: 4 mg via ORAL
  Filled 2016-11-10: qty 1

## 2016-11-10 NOTE — ED Triage Notes (Signed)
Pt ambulatory to triage with steady gait, no distress noted. Pt c/o of lower RQ abdominal pain, N/V/D since 2100 last night. Pt denies taking OTC medication before arrival.

## 2018-04-23 LAB — HM HIV SCREENING LAB: HM HIV Screening: NEGATIVE

## 2019-01-06 ENCOUNTER — Other Ambulatory Visit: Payer: Self-pay

## 2019-01-06 ENCOUNTER — Emergency Department
Admission: EM | Admit: 2019-01-06 | Discharge: 2019-01-06 | Disposition: A | Discharge status: Home / Self Care | Payer: Self-pay | Attending: Emergency Medicine | Admitting: Emergency Medicine

## 2019-01-06 DIAGNOSIS — Z20828 Contact with and (suspected) exposure to other viral communicable diseases: Secondary | ICD-10-CM | POA: Insufficient documentation

## 2019-01-06 DIAGNOSIS — Z79899 Other long term (current) drug therapy: Secondary | ICD-10-CM | POA: Insufficient documentation

## 2019-01-06 DIAGNOSIS — M791 Myalgia, unspecified site: Secondary | ICD-10-CM | POA: Insufficient documentation

## 2019-01-06 DIAGNOSIS — L03112 Cellulitis of left axilla: Secondary | ICD-10-CM | POA: Insufficient documentation

## 2019-01-06 DIAGNOSIS — R51 Headache: Secondary | ICD-10-CM | POA: Insufficient documentation

## 2019-01-06 LAB — URINALYSIS, COMPLETE (UACMP) WITH MICROSCOPIC
Bacteria, UA: NONE SEEN
Bilirubin Urine: NEGATIVE
Glucose, UA: NEGATIVE mg/dL
Hgb urine dipstick: NEGATIVE
Ketones, ur: NEGATIVE mg/dL
Leukocytes,Ua: NEGATIVE
Nitrite: NEGATIVE
Protein, ur: NEGATIVE mg/dL
Specific Gravity, Urine: 1.024 (ref 1.005–1.030)
pH: 6 (ref 5.0–8.0)

## 2019-01-06 LAB — CBC WITH DIFFERENTIAL/PLATELET
Abs Immature Granulocytes: 0.04 10*3/uL (ref 0.00–0.07)
Basophils Absolute: 0 10*3/uL (ref 0.0–0.1)
Basophils Relative: 0 %
Eosinophils Absolute: 0 10*3/uL (ref 0.0–0.5)
Eosinophils Relative: 0 %
HCT: 41.5 % (ref 36.0–46.0)
Hemoglobin: 13.3 g/dL (ref 12.0–15.0)
Immature Granulocytes: 0 %
Lymphocytes Relative: 15 %
Lymphs Abs: 1.8 10*3/uL (ref 0.7–4.0)
MCH: 26.3 pg (ref 26.0–34.0)
MCHC: 32 g/dL (ref 30.0–36.0)
MCV: 82.2 fL (ref 80.0–100.0)
Monocytes Absolute: 1.1 10*3/uL — ABNORMAL HIGH (ref 0.1–1.0)
Monocytes Relative: 9 %
Neutro Abs: 9.3 10*3/uL — ABNORMAL HIGH (ref 1.7–7.7)
Neutrophils Relative %: 76 %
Platelets: 262 10*3/uL (ref 150–400)
RBC: 5.05 MIL/uL (ref 3.87–5.11)
RDW: 13.3 % (ref 11.5–15.5)
WBC: 12.2 10*3/uL — ABNORMAL HIGH (ref 4.0–10.5)
nRBC: 0 % (ref 0.0–0.2)

## 2019-01-06 LAB — COMPREHENSIVE METABOLIC PANEL
ALT: 28 U/L (ref 0–44)
AST: 21 U/L (ref 15–41)
Albumin: 4.1 g/dL (ref 3.5–5.0)
Alkaline Phosphatase: 71 U/L (ref 38–126)
Anion gap: 10 (ref 5–15)
BUN: 7 mg/dL (ref 6–20)
CO2: 20 mmol/L — ABNORMAL LOW (ref 22–32)
Calcium: 8.6 mg/dL — ABNORMAL LOW (ref 8.9–10.3)
Chloride: 104 mmol/L (ref 98–111)
Creatinine, Ser: 0.79 mg/dL (ref 0.44–1.00)
GFR calc Af Amer: >60 mL/min (ref >60)
GFR calc non Af Amer: >60 mL/min (ref >60)
Glucose, Bld: 129 mg/dL — ABNORMAL HIGH (ref 70–99)
Potassium: 3.9 mmol/L (ref 3.5–5.1)
Sodium: 134 mmol/L — ABNORMAL LOW (ref 135–145)
Total Bilirubin: 0.6 mg/dL (ref 0.3–1.2)
Total Protein: 8.1 g/dL (ref 6.5–8.1)

## 2019-01-06 LAB — POCT PREGNANCY, URINE: Preg Test, Ur: NEGATIVE

## 2019-01-06 MED ORDER — CLINDAMYCIN PHOSPHATE 600 MG/50ML IV SOLN
600.0000 mg | Freq: Once | INTRAVENOUS | Status: AC
  Administered 2019-01-06: 600 mg via INTRAVENOUS
  Filled 2019-01-06 (×2): qty 50

## 2019-01-06 MED ORDER — CLINDAMYCIN HCL 300 MG PO CAPS: 300.0000 mg | ORAL_CAPSULE | Freq: Three times a day (TID) | ORAL | 0 refills | Status: DC

## 2019-01-06 MED ORDER — SODIUM CHLORIDE 0.9 % IV BOLUS
1000.0000 mL | Freq: Once | INTRAVENOUS | Status: AC
  Administered 2019-01-06: 1000 mL via INTRAVENOUS

## 2019-01-06 MED ORDER — ACETAMINOPHEN 325 MG PO TABS
ORAL_TABLET | ORAL | Status: AC
  Filled 2019-01-06: qty 2

## 2019-01-06 MED ORDER — SODIUM CHLORIDE 0.9% FLUSH: 3.0000 mL | Freq: Once | INTRAVENOUS | Status: DC

## 2019-01-06 MED ORDER — ACETAMINOPHEN 325 MG PO TABS
650.0000 mg | ORAL_TABLET | Freq: Once | ORAL | Status: AC | PRN
  Administered 2019-01-06: 650 mg via ORAL

## 2019-01-06 NOTE — ED Triage Notes (Addendum)
Pt c/o fever, bodyaches chills,. HA and has an abscess under the left axillary since Saturday. Works at [employer].

## 2019-01-06 NOTE — ED Notes (Signed)
Urine specimen,green,and purple top tubes have been sent to the lab.

## 2019-01-06 NOTE — ED Notes (Signed)

## 2019-01-06 NOTE — ED Provider Notes (Signed)
[facility] Emergency Department Provider Note   ____________________________________________   None    (approximate)  I have reviewed the triage vital signs and the nursing notes.   HISTORY  Chief Complaint Abscess and Fever    HPI Alexis Elliott is a 28 y.o. female reports no significant past medical history   Since about Saturday, the patient has been noticing that she is feeling achy.  She is had some muscle aches.  No cough.  She has had a fever off and on and did not go to work yesterday because of a fever.  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  No scratchy throat.  No shortness of breath.  No cough.  No chest pain.  She has also noticed that she has an area that she thinks may be an abscess forming underneath her left armpit.  That started around the same time as her symptoms.  She is continued to eat and drink fine.  Denies pregnancy.  No abdominal pain.  No vomiting.  Reports that she did take Tylenol last night, she does not intend to go to work today.  Pain is mild under the left armpit except when she touches it there is an area that feels slightly swollen and is tender to the touch and warm.  History reviewed. No pertinent past medical history.  There are no active problems to display for this patient.   Past Surgical History:  Procedure Laterality Date   FINGER SURGERY      Prior to Admission medications   Medication Sig Start Date End Date Taking? Authorizing Provider  brompheniramine-pseudoephedrine-DM 30-2-10 MG/5ML syrup Take 10 mLs by mouth 4 (four) times daily as needed. 07/31/16   Cuthriell, Delorise RoyalsJonathan D, PA-C  cephALEXin (KEFLEX) 500 MG capsule Take 1 capsule (500 mg total) by mouth 4 (four) times daily. 08/13/16   Cuthriell, Delorise RoyalsJonathan D, PA-C  clindamycin (CLEOCIN) 300 MG capsule Take 1 capsule (300 mg total) by mouth 3 (three) times daily. 01/06/19   Sharyn CreamerQuale, Johntavious Francom, MD  cyclobenzaprine (FLEXERIL)  10 MG tablet Take 1 tablet (10 mg total) by mouth 3 (three) times daily as needed for muscle spasms. 08/13/16   Cuthriell, Delorise RoyalsJonathan D, PA-C  meloxicam (MOBIC) 15 MG tablet Take 1 tablet (15 mg total) by mouth daily. 08/13/16   Cuthriell, Delorise RoyalsJonathan D, PA-C  methocarbamol (ROBAXIN) 500 MG tablet Take 1 tablet (500 mg total) by mouth every 8 (eight) hours as needed for muscle spasms. 09/20/16   Payton Mccallumonty, Orlando, MD  ondansetron (ZOFRAN-ODT) 4 MG disintegrating tablet Take 1 tablet (4 mg total) by mouth every 8 (eight) hours as needed for nausea or vomiting. 07/31/16   Cuthriell, Delorise RoyalsJonathan D, PA-C  phenazopyridine (PYRIDIUM) 95 MG tablet Take 1 tablet (95 mg total) by mouth 3 (three) times daily as needed for pain. 08/13/16   Cuthriell, Delorise RoyalsJonathan D, PA-C  Denies being on any medications at present time except some as needed Tylenol  Allergies Patient has no known allergies.  Family History  Problem Relation Age of Onset   Sickle cell trait Father    Diabetes Maternal Grandmother    Hypertension Maternal Grandmother    Diabetes Paternal Grandmother     Social History Social History   Tobacco Use   Smoking status: Never Smoker   Smokeless tobacco: Never Used  Substance Use Topics   Alcohol use: No   Drug use: Yes    Types: Marijuana    Review of  Systems Constitutional: Fever, some chills and muscle aches.  Slight fatigue. Eyes: No visual changes. ENT: No sore throat. Cardiovascular: Denies chest pain.  Pain underneath the left armpit Respiratory: Denies shortness of breath. Gastrointestinal: No abdominal pain.   Genitourinary: Negative for dysuria.  Denies pregnancy. Musculoskeletal: Negative for back pain. Skin: Negative for rash but feels warm and slightly swollen in her left armpit. Neurological: Negative forareas of focal weakness or numbness.  He has had a mild off-and-on headache as well the last 2 days.  No neck stiffness or  pain.    ____________________________________________   PHYSICAL EXAM:  VITAL SIGNS: ED Triage Vitals  Enc Vitals Group     BP 01/06/19 1156 131/87     Pulse Rate 01/06/19 1156 (!) 107     Resp 01/06/19 1156 19     Temp 01/06/19 1156 (!) 101.3 F (38.5 C)     Temp Source 01/06/19 1156 Oral     SpO2 01/06/19 1156 99 %     Weight 01/06/19 1157 185 lb (83.9 kg)     Height 01/06/19 1157 5\' 2"  (1.575 m)     Head Circumference --      Peak Flow --      Pain Score 01/06/19 1201 8     Pain Loc --      Pain Edu? --      Excl. in GC? --     Constitutional: Alert and oriented. Well appearing and in no acute distress.  He is very pleasant, nontoxic. Eyes: Conjunctivae are normal. Head: Atraumatic. Nose: No congestion/rhinnorhea. Mouth/Throat: Mucous membranes are moist. Neck: No stridor.  Cardiovascular: Just minimally tachycardic rate, regular rhythm. Grossly normal heart sounds.  Good peripheral circulation. Respiratory: Normal respiratory effort.  No retractions. Lungs CTAB. Gastrointestinal: Soft and nontender. No distention. Musculoskeletal: No lower extremity tenderness nor edema. Neurologic:  Normal speech and language. No gross focal neurologic deficits are appreciated.  Skin:  Skin is warm, dry and intact. No rash noted except in her left axilla she has an area that is less than 1-1/2 cm in size that is slightly indurated, tender to touch and there is surrounding warmth over an area about the size of the palm under the left axilla.  There are no other areas of swelling edema or redness noted on exam.. Psychiatric: Mood and affect are normal. Speech and behavior are normal.  Ultrasound soft tissue evaluated in the left axilla, she does have areas of what appears to be edema in the overlying skin over the area of induration, but there is no discernible abscess or fluid collection lying below. ____________________________________________   LABS (all labs ordered are listed,  but only abnormal results are displayed)  Labs Reviewed  COMPREHENSIVE METABOLIC PANEL - Abnormal; Notable for the following components:      Result Value   Sodium 134 (*)    CO2 20 (*)    Glucose, Bld 129 (*)    Calcium 8.6 (*)    All other components within normal limits  CBC WITH DIFFERENTIAL/PLATELET - Abnormal; Notable for the following components:   WBC 12.2 (*)    Neutro Abs 9.3 (*)    Monocytes Absolute 1.1 (*)    All other components within normal limits  URINALYSIS, COMPLETE (UACMP) WITH MICROSCOPIC - Abnormal; Notable for the following components:   Color, Urine YELLOW (*)    APPearance HAZY (*)    All other components within normal limits  NOVEL CORONAVIRUS, NAA (HOSPITAL ORDER, SEND-OUT TO REF LAB)  POC URINE PREG, ED  POCT PREGNANCY, URINE   ____________________________________________  EKG   ____________________________________________  RADIOLOGY   ____________________________________________   PROCEDURES  Procedure(s) performed: None  Procedures  Critical Care performed: No  ____________________________________________   INITIAL IMPRESSION / ASSESSMENT AND PLAN / ED COURSE  Pertinent labs & imaging results that were available during my care of the patient were reviewed by me and considered in my medical decision making (see chart for details).   Patient presents for fever, myalgias, slight headache and also has evidence of focal bacterial infection underneath the left armpit.  She is nontoxic and well-appearing.  Discussed risks and benefits of different antibiotic therapies including the risk of C. difficile and side effects of clindamycin, but also discussed risk benefits of cephalexin and Bactrim.  After discussion shared medical decision making patient elected for clindamycin treatment.  She does not appear acutely ill, she is slightly tachycardic but also febrile.  Plan to hydrate, give additional dose of IV clindamycin, and treat for cellulitis  involving left armpit and advised the patient if the indurated area becomes worse larger, or feels like it is enlarging to come back and see if it has turned into a possible drainable fluid collection.  Patient in agreement.  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  After fluids and antipyretic patient reports her headaches gone and she is feeling a lot better.  She looks well in no distress comfortable with plan for antibiotic and careful return precautions.  She will also stay out of work pending her COVID test and avoid others/take appropriate precautions  Return precautions and treatment recommendations and follow-up discussed with the patient who is agreeable with the plan.  Today's Vitals   01/06/19 1233 01/06/19 1326 01/06/19 1326 01/06/19 1423  BP:    (!) 133/99  Pulse:    93  Resp:    18  Temp:   98.5 F (36.9 C) 98.4 F (36.9 C)  TempSrc:   Oral Oral  SpO2:    99%  Weight:      Height:      PainSc: 8  4   3     Body mass index is 33.84 kg/m.       ____________________________________________   FINAL CLINICAL IMPRESSION(S) / ED DIAGNOSES  Final diagnoses:  Cellulitis of axilla, left        Note:  This document was prepared using Dragon voice recognition software and may include unintentional dictation errors       Delman Kitten, MD 01/06/19 1432

## 2019-01-07 LAB — NOVEL CORONAVIRUS, NAA (HOSP ORDER, SEND-OUT TO REF LAB; TAT 18-24 HRS): SARS-CoV-2, NAA: NOT DETECTED

## 2019-01-23 ENCOUNTER — Ambulatory Visit (LOCAL_COMMUNITY_HEALTH_CENTER): Payer: Medicaid Other

## 2019-01-23 ENCOUNTER — Other Ambulatory Visit: Payer: Self-pay

## 2019-01-23 VITALS — BP 133/89 | Ht 62.0 in | Wt 175.6 lb

## 2019-01-23 DIAGNOSIS — Z30013 Encounter for initial prescription of injectable contraceptive: Secondary | ICD-10-CM

## 2019-01-23 DIAGNOSIS — Z3009 Encounter for other general counseling and advice on contraception: Secondary | ICD-10-CM

## 2019-01-23 DIAGNOSIS — Z3042 Encounter for surveillance of injectable contraceptive: Secondary | ICD-10-CM | POA: Insufficient documentation

## 2019-01-23 MED ORDER — MEDROXYPROGESTERONE ACETATE 150 MG/ML IM SUSP
150.0000 mg | Freq: Once | INTRAMUSCULAR | Status: AC
  Administered 2019-01-23: 09:00:00 150 mg via INTRAMUSCULAR

## 2019-01-23 NOTE — Progress Notes (Signed)
Pt to clinic for depo. Pt has not had physical with [facility] since 2018; RP due (needs ASAP after Covid 19 precautions are lifted). No problems. Last depo at [facility] was 10/24/2018; today 13.0 weeks since last depo. Has been using condoms "always" since restarting depo 10/24/2018. Consulted with Jerline Pain, FNP regarding pt request and details. Per verbal order by K.Leath, FNP, pt given Depo today and counseled that she must have physical before or with next depo.

## 2019-04-25 ENCOUNTER — Other Ambulatory Visit: Payer: Self-pay

## 2019-04-25 ENCOUNTER — Ambulatory Visit (LOCAL_COMMUNITY_HEALTH_CENTER): Payer: Medicaid Other

## 2019-04-25 VITALS — BP 123/78 | Ht 62.0 in | Wt 178.0 lb

## 2019-04-25 DIAGNOSIS — Z3009 Encounter for other general counseling and advice on contraception: Secondary | ICD-10-CM

## 2019-04-25 DIAGNOSIS — Z30013 Encounter for initial prescription of injectable contraceptive: Secondary | ICD-10-CM | POA: Diagnosis not present

## 2019-04-25 MED ORDER — MEDROXYPROGESTERONE ACETATE 150 MG/ML IM SUSP
150.0000 mg | Freq: Once | INTRAMUSCULAR | Status: AC
  Administered 2019-04-25: 15:00:00 150 mg via INTRAMUSCULAR

## 2019-04-25 MED ORDER — MULTI-VITAMIN/MINERALS PO TABS: 1.0000 | ORAL_TABLET | Freq: Every day | ORAL | 0 refills | Status: DC

## 2019-04-25 NOTE — Progress Notes (Signed)
Presents to Nurse Clinic for Depo. Last physical at [facility] was 10/2016 with subsequent counseling that provider appt needed when received Depo pe r one time order in 10/2018 and 01/2019. Per verbal order of Antoine Primas PA today: 1) Depo 150 mg IM x1 today, 2) prior to further Depo injections, face to face appt with provider needed and if agency not yet doing physicals, to request provider appt. Client counseled regarding above and verbalized understanding. Client tolerated Depo injection without complaint. Rich Number, RN

## 2019-08-29 ENCOUNTER — Emergency Department: Payer: Self-pay

## 2019-08-29 ENCOUNTER — Encounter: Payer: Self-pay | Admitting: Emergency Medicine

## 2019-08-29 ENCOUNTER — Other Ambulatory Visit: Payer: Medicaid Other

## 2019-08-29 ENCOUNTER — Emergency Department
Admission: EM | Admit: 2019-08-29 | Discharge: 2019-08-29 | Disposition: A | Discharge status: Home / Self Care | Payer: Self-pay | Attending: Student in an Organized Health Care Education/Training Program | Admitting: Student in an Organized Health Care Education/Training Program

## 2019-08-29 ENCOUNTER — Other Ambulatory Visit: Payer: Self-pay

## 2019-08-29 ENCOUNTER — Ambulatory Visit: Payer: Medicaid Other

## 2019-08-29 DIAGNOSIS — Z79899 Other long term (current) drug therapy: Secondary | ICD-10-CM | POA: Insufficient documentation

## 2019-08-29 DIAGNOSIS — R0789 Other chest pain: Secondary | ICD-10-CM | POA: Insufficient documentation

## 2019-08-29 DIAGNOSIS — R7989 Other specified abnormal findings of blood chemistry: Secondary | ICD-10-CM | POA: Insufficient documentation

## 2019-08-29 DIAGNOSIS — Z20822 Contact with and (suspected) exposure to covid-19: Secondary | ICD-10-CM | POA: Insufficient documentation

## 2019-08-29 LAB — URINALYSIS, COMPLETE (UACMP) WITH MICROSCOPIC
Bacteria, UA: NONE SEEN
Bilirubin Urine: NEGATIVE
Glucose, UA: NEGATIVE mg/dL
Hgb urine dipstick: NEGATIVE
Ketones, ur: 20 mg/dL — AB
Leukocytes,Ua: NEGATIVE
Nitrite: NEGATIVE
Protein, ur: 30 mg/dL — AB
Specific Gravity, Urine: 1.031 — ABNORMAL HIGH (ref 1.005–1.030)
pH: 6 (ref 5.0–8.0)

## 2019-08-29 LAB — BASIC METABOLIC PANEL
Anion gap: 9 (ref 5–15)
BUN: 9 mg/dL (ref 6–20)
CO2: 24 mmol/L (ref 22–32)
Calcium: 8.8 mg/dL — ABNORMAL LOW (ref 8.9–10.3)
Chloride: 105 mmol/L (ref 98–111)
Creatinine, Ser: 0.77 mg/dL (ref 0.44–1.00)
GFR calc Af Amer: >60 mL/min (ref >60)
GFR calc non Af Amer: >60 mL/min (ref >60)
Glucose, Bld: 99 mg/dL (ref 70–99)
Potassium: 3.8 mmol/L (ref 3.5–5.1)
Sodium: 138 mmol/L (ref 135–145)

## 2019-08-29 LAB — CBC
HCT: 42.1 % (ref 36.0–46.0)
Hemoglobin: 13.1 g/dL (ref 12.0–15.0)
MCH: 26.5 pg (ref 26.0–34.0)
MCHC: 31.1 g/dL (ref 30.0–36.0)
MCV: 85.1 fL (ref 80.0–100.0)
Platelets: 267 10*3/uL (ref 150–400)
RBC: 4.95 MIL/uL (ref 3.87–5.11)
RDW: 13.8 % (ref 11.5–15.5)
WBC: 9.1 10*3/uL (ref 4.0–10.5)
nRBC: 0 % (ref 0.0–0.2)

## 2019-08-29 LAB — FIBRIN DERIVATIVES D-DIMER (ARMC ONLY): Fibrin derivatives D-dimer (ARMC): 903.81 ng/mL (FEU) — ABNORMAL HIGH (ref 0.00–499.00)

## 2019-08-29 LAB — SARS CORONAVIRUS 2 (TAT 6-24 HRS): SARS Coronavirus 2: NEGATIVE

## 2019-08-29 LAB — POCT PREGNANCY, URINE: Preg Test, Ur: NEGATIVE

## 2019-08-29 LAB — TROPONIN I (HIGH SENSITIVITY)
Troponin I (High Sensitivity): <2 ng/L (ref <18)
Troponin I (High Sensitivity): <2 ng/L (ref <18)

## 2019-08-29 MED ORDER — ONDANSETRON HCL 4 MG/2ML IJ SOLN
4.0000 mg | Freq: Once | INTRAMUSCULAR | Status: AC
  Administered 2019-08-29: 4 mg via INTRAVENOUS
  Filled 2019-08-29: qty 2

## 2019-08-29 MED ORDER — NAPROXEN 500 MG PO TABS: 500.0000 mg | ORAL_TABLET | Freq: Two times a day (BID) | ORAL | 0 refills | Status: DC

## 2019-08-29 MED ORDER — SODIUM CHLORIDE 0.9 % IV BOLUS
500.0000 mL | Freq: Once | INTRAVENOUS | Status: AC
  Administered 2019-08-29: 12:00:00 500 mL via INTRAVENOUS

## 2019-08-29 MED ORDER — MORPHINE SULFATE (PF) 4 MG/ML IV SOLN
4.0000 mg | INTRAVENOUS | Status: DC | PRN
  Administered 2019-08-29: 4 mg via INTRAVENOUS
  Filled 2019-08-29: qty 1

## 2019-08-29 MED ORDER — HYDROCODONE-ACETAMINOPHEN 5-325 MG PO TABS: 1.0000 | ORAL_TABLET | ORAL | 0 refills | Status: DC | PRN

## 2019-08-29 MED ORDER — IOHEXOL 350 MG/ML SOLN
75.0000 mL | Freq: Once | INTRAVENOUS | Status: AC | PRN
  Administered 2019-08-29: 75 mL via INTRAVENOUS

## 2019-08-29 NOTE — ED Provider Notes (Signed)
[facility] Emergency Department Provider Note    First MD Initiated Contact with Patient 08/29/19 [phone]     (approximate)  I have reviewed the triage vital signs and the nursing notes.   HISTORY  Chief Complaint Chest Pain    HPI Alexis Elliott is a 29 y.o. female [place] past medical history presents to the ER for evaluation of moderate left-sided chest pain or shortness of breath for the past 4 days.  Does hurt worse with taking deep inspiration.  Has never had pain like this before.  Somewhat worsened with movement and reproducible palpation.  She is on Depo.  Denies any history of blood clots.  No history of cardiac disease.  Does not have any abdominal pain.  No recent fevers or congestion.    History reviewed. No pertinent past medical history. Family History  Problem Relation Age of Onset  . Sickle cell trait Father   . Diabetes Maternal Grandmother   . Hypertension Maternal Grandmother   . Diabetes Paternal Grandmother    Past Surgical History:  Procedure Laterality Date  . FINGER SURGERY     Patient Active Problem List   Diagnosis Date Noted  . Surveillance for Depo-Provera contraception 01/23/2019      Prior to Admission medications   Medication Sig Start Date End Date Taking? Authorizing Provider  Multiple Vitamins-Minerals (MULTIVITAMIN WITH MINERALS) tablet Take 1 tablet by mouth daily. 04/25/19  Yes Hampton, Marylynn Pearson, PA  brompheniramine-pseudoephedrine-DM 30-2-10 MG/5ML syrup Take 10 mLs by mouth 4 (four) times daily as needed. Patient not taking: Reported on 01/23/2019 07/31/16   Cuthriell, Delorise Royals, PA-C  cephALEXin (KEFLEX) 500 MG capsule Take 1 capsule (500 mg total) by mouth 4 (four) times daily. Patient not taking: Reported on 01/23/2019 08/13/16   Cuthriell, Delorise Royals, PA-C  clindamycin (CLEOCIN) 300 MG capsule Take 1 capsule (300 mg total) by mouth 3 (three) times daily. Patient not taking: Reported on 01/23/2019 01/06/19    Sharyn Creamer, MD  cyclobenzaprine (FLEXERIL) 10 MG tablet Take 1 tablet (10 mg total) by mouth 3 (three) times daily as needed for muscle spasms. Patient not taking: Reported on 01/23/2019 08/13/16   Cuthriell, Delorise Royals, PA-C  HYDROcodone-acetaminophen (NORCO) 5-325 MG tablet Take 1 tablet by mouth every 4 (four) hours as needed for moderate pain. 08/29/19   Willy Eddy, MD  meloxicam (MOBIC) 15 MG tablet Take 1 tablet (15 mg total) by mouth daily. Patient not taking: Reported on 01/23/2019 08/13/16   Cuthriell, Delorise Royals, PA-C  methocarbamol (ROBAXIN) 500 MG tablet Take 1 tablet (500 mg total) by mouth every 8 (eight) hours as needed for muscle spasms. Patient not taking: Reported on 01/23/2019 09/20/16   Payton Mccallum, MD  naproxen (NAPROSYN) 500 MG tablet Take 1 tablet (500 mg total) by mouth 2 (two) times daily with a meal. 08/29/19 08/28/20  Willy Eddy, MD  ondansetron (ZOFRAN-ODT) 4 MG disintegrating tablet Take 1 tablet (4 mg total) by mouth every 8 (eight) hours as needed for nausea or vomiting. Patient not taking: Reported on 01/23/2019 07/31/16   Cuthriell, Delorise Royals, PA-C  phenazopyridine (PYRIDIUM) 95 MG tablet Take 1 tablet (95 mg total) by mouth 3 (three) times daily as needed for pain. Patient not taking: Reported on 01/23/2019 08/13/16   Cuthriell, Delorise Royals, PA-C    Allergies Patient has no known allergies.    Social History Social History   Tobacco Use  . Smoking status: Never Smoker  . Smokeless tobacco: Never Used  Substance Use Topics  . Alcohol use: No  . Drug use: Yes    Types: Marijuana    Review of Systems Patient denies headaches, rhinorrhea, blurry vision, numbness, shortness of breath, chest pain, edema, cough, abdominal pain, nausea, vomiting, diarrhea, dysuria, fevers, rashes or hallucinations unless otherwise stated above in HPI. ____________________________________________   PHYSICAL EXAM:  VITAL SIGNS: Vitals:   08/29/19 1445 08/29/19 1500    BP:  128/82  Pulse: 88 91  Resp:  17  Temp:    SpO2: 96% 97%    Constitutional: Alert and oriented.  Eyes: Conjunctivae are normal.  Head: Atraumatic. Nose: No congestion/rhinnorhea. Mouth/Throat: Mucous membranes are moist.   Neck: No stridor. Painless ROM.  Cardiovascular: Normal rate, regular rhythm. Grossly normal heart sounds.  Good peripheral circulation. Respiratory: Normal respiratory effort.  No retractions. Lungs CTAB. Gastrointestinal: Soft and nontender. No distention. No abdominal bruits. No CVA tenderness. Genitourinary:  Musculoskeletal:  Left sided chest wall ttp. No lower extremity tenderness nor edema.  No joint effusions. Neurologic:  Normal speech and language. No gross focal neurologic deficits are appreciated. No facial droop Skin:  Skin is warm, dry and intact. No rash noted. Psychiatric: Mood and affect are normal. Speech and behavior are normal.  ____________________________________________   LABS (all labs ordered are listed, but only abnormal results are displayed)  Results for orders placed or performed during the hospital encounter of 08/29/19 (from the past 24 hour(s))  Basic metabolic panel     Status: Abnormal   Collection Time: 08/29/19  8:15 AM  Result Value Ref Range   Sodium 138 135 - 145 mmol/L   Potassium 3.8 3.5 - 5.1 mmol/L   Chloride 105 98 - 111 mmol/L   CO2 24 22 - 32 mmol/L   Glucose, Bld 99 70 - 99 mg/dL   BUN 9 6 - 20 mg/dL   Creatinine, Ser 4.27 0.44 - 1.00 mg/dL   Calcium 8.8 (L) 8.9 - 10.3 mg/dL   GFR calc non Af Amer >60 >60 mL/min   GFR calc Af Amer >60 >60 mL/min   Anion gap 9 5 - 15  CBC     Status: None   Collection Time: 08/29/19  8:15 AM  Result Value Ref Range   WBC 9.1 4.0 - 10.5 K/uL   RBC 4.95 3.87 - 5.11 MIL/uL   Hemoglobin 13.1 12.0 - 15.0 g/dL   HCT 06.2 37.6 - 28.3 %   MCV 85.1 80.0 - 100.0 fL   MCH 26.5 26.0 - 34.0 pg   MCHC 31.1 30.0 - 36.0 g/dL   RDW 15.1 76.1 - 60.7 %   Platelets 267 150 - 400  K/uL   nRBC 0.0 0.0 - 0.2 %  Troponin I (High Sensitivity)     Status: None   Collection Time: 08/29/19  8:15 AM  Result Value Ref Range   Troponin I (High Sensitivity) <2 <18 ng/L  Fibrin derivatives D-Dimer ([facility] only)     Status: Abnormal   Collection Time: 08/29/19  9:35 AM  Result Value Ref Range   Fibrin derivatives D-dimer ([facility]) 903.81 (H) 0.00 - 499.00 ng/mL (FEU)  Urinalysis, Complete w Microscopic     Status: Abnormal   Collection Time: 08/29/19  9:46 AM  Result Value Ref Range   Color, Urine YELLOW (A) YELLOW   APPearance HAZY (A) CLEAR   Specific Gravity, Urine 1.031 (H) 1.005 - 1.030   pH 6.0 5.0 - 8.0   Glucose, UA NEGATIVE NEGATIVE mg/dL  Hgb urine dipstick NEGATIVE NEGATIVE   Bilirubin Urine NEGATIVE NEGATIVE   Ketones, ur 20 (A) NEGATIVE mg/dL   Protein, ur 30 (A) NEGATIVE mg/dL   Nitrite NEGATIVE NEGATIVE   Leukocytes,Ua NEGATIVE NEGATIVE   RBC / HPF 0-5 0 - 5 RBC/hpf   WBC, UA 0-5 0 - 5 WBC/hpf   Bacteria, UA NONE SEEN NONE SEEN   Squamous Epithelial / LPF 0-5 0 - 5   Mucus PRESENT   Troponin I (High Sensitivity)     Status: None   Collection Time: 08/29/19  9:47 AM  Result Value Ref Range   Troponin I (High Sensitivity) <2 <18 ng/L  Pregnancy, urine POC     Status: None   Collection Time: 08/29/19 10:39 AM  Result Value Ref Range   Preg Test, Ur NEGATIVE NEGATIVE   ____________________________________________  EKG My review and personal interpretation at Time: 8:10   Indication: chest pain  Rate: 80  Rhythm: sinus Axis: normal Other: normal intervals, no stemi ____________________________________________  RADIOLOGY  I personally reviewed all radiographic images ordered to evaluate for the above acute complaints and reviewed radiology reports and findings.  These findings were personally discussed with the patient.  Please see medical record for radiology report.  ____________________________________________   PROCEDURES  Procedure(s)  performed:  Procedures    Critical Care performed: no ____________________________________________   INITIAL IMPRESSION / ASSESSMENT AND PLAN / ED COURSE  Pertinent labs & imaging results that were available during my care of the patient were reviewed by me and considered in my medical decision making (see chart for details).   DDX: ACS, pericarditis, pe, dissection, pna, bronchitis, costochondritis   Alexis Elliott is a 29 y.o. who presents to the ED with left-sided chest pain as described above.  She is very well-appearing clinically.  Pain does seem to be reproducible with palpation.  No evidence of overlying cellulitis or rash.  She is on Depo but otherwise low risk by Wells criteria will send D-dimer.  Her troponin is negative.  EKG nonischemic.  Seems more consistent with pericarditis.  Her abdominal exam is soft and benign.  The patient will be placed on continuous pulse oximetry and telemetry for monitoring.  Laboratory evaluation will be sent to evaluate for the above complaints.     Clinical Course as of Aug 28 1513  Fri Aug 29, 2019  1439 Patient reassessed.  Work-up has been quite reassuring.  No evidence suggest ACS.  Is not consistent with dissection or PE.  No evidence of lower extremity DVT.  No evidence of infiltrate.  Abdominal exam soft and benign.  At this point believe she stable and appropriate for outpatient follow-up.  Have discussed with the patient and available family all diagnostics and treatments performed thus far and all questions were answered to the best of my ability. The patient demonstrates understanding and agreement with plan.    [PR]    Clinical Course User Index [PR] Willy Eddy, MD    The patient was evaluated in Emergency Department today for the symptoms described in the history of present illness. He/she was evaluated in the context of the global COVID-19 pandemic, which necessitated consideration that the patient might be at risk for  infection with the SARS-CoV-2 virus that causes COVID-19. Institutional protocols and algorithms that pertain to the evaluation of patients at risk for COVID-19 are in a state of rapid change based on information released by regulatory bodies including the CDC and federal and state organizations. These policies  and algorithms were followed during the patient's care in the ED.  As part of my medical decision making, I reviewed the following data within the [place] notes reviewed and incorporated, Labs reviewed, notes from prior ED visits and [place] Controlled Substance Database   ____________________________________________   FINAL CLINICAL IMPRESSION(S) / ED DIAGNOSES  Final diagnoses:  Atypical chest pain      NEW MEDICATIONS STARTED DURING THIS VISIT:  New Prescriptions   HYDROCODONE-ACETAMINOPHEN (NORCO) 5-325 MG TABLET    Take 1 tablet by mouth every 4 (four) hours as needed for moderate pain.   NAPROXEN (NAPROSYN) 500 MG TABLET    Take 1 tablet (500 mg total) by mouth 2 (two) times daily with a meal.     Note:  This document was prepared using Dragon voice recognition software and may include unintentional dictation errors.    Merlyn Lot, MD 08/29/19 1515

## 2019-08-29 NOTE — ED Triage Notes (Signed)
Pt presents to ED c/o L-sided chest pain x4 days. Pt arrives hold L rib cage, states it hurts to breathe. Denies known Covid contacts.

## 2019-08-29 NOTE — ED Notes (Signed)
Pt in CT, will administer meds when returns.

## 2019-08-29 NOTE — ED Notes (Signed)
Pt informed of need for urine sample. Provided specimen cup at this time.

## 2019-09-05 ENCOUNTER — Ambulatory Visit: Payer: Medicaid Other

## 2019-10-02 NOTE — Progress Notes (Signed)
PCP:  [facility]   Chief Complaint  Patient presents with  . Gynecologic Exam     HPI:      [name] is a 29 y.o. G2P1011 who LMP was No LMP recorded. Patient has had an injection., presents today for her NP annual examination.  Her menses are absent due to depo. Dysmenorrhea randomly, improved with NSAIDs. She does not have intermenstrual bleeding.  Sex activity: single partner, contraception - Depo-Provera injections.  Last Pap: not recent; hx of dysplasia in past, no tx needed. Hx of STDs: GC, chlamydia, HPV  There is no FH of breast cancer. There is no FH of ovarian cancer. The patient does do self-breast exams.  Tobacco use: The patient denies current or previous tobacco use. Alcohol use: none QOD marijuana use. Exercise: moderately active  She does get adequate calcium but not Vitamin D in her diet.   Past Medical History:  Diagnosis Date  . Sickle cell trait [facility])     Past Surgical History:  Procedure Laterality Date  . FINGER SURGERY      Family History  Problem Relation Age of Onset  . Sickle cell trait Father   . Diabetes Maternal Grandmother   . Hypertension Maternal Grandmother   . Diabetes Paternal Grandmother     Social History   Socioeconomic History  . Marital status: Single    Spouse name: Not on file  . Number of children: Not on file  . Years of education: Not on file  . Highest education level: Not on file  Occupational History  . Not on file  Tobacco Use  . Smoking status: Never Smoker  . Smokeless tobacco: Never Used  Substance and Sexual Activity  . Alcohol use: No  . Drug use: Yes    Types: Marijuana  . Sexual activity: Yes    Birth control/protection: Injection  Other Topics Concern  . Not on file  Social History Narrative  . Not on file   Social Determinants of Health   Financial Resource Strain:   . Difficulty of Paying Living Expenses:   Food Insecurity:   . Worried About Programme researcher, broadcasting/film/video  in the Last Year:   . Barista in the Last Year:   Transportation Needs:   . Freight forwarder (Medical):   Marland Kitchen Lack of Transportation (Non-Medical):   Physical Activity:   . Days of Exercise per Week:   . Minutes of Exercise per Session:   Stress:   . Feeling of Stress :   Social Connections:   . Frequency of Communication with Friends and Family:   . Frequency of Social Gatherings with Friends and Family:   . Attends Religious Services:   . Active Member of Clubs or Organizations:   . Attends Banker Meetings:   Marland Kitchen Marital Status:   Intimate Partner Violence:   . Fear of Current or Ex-Partner:   . Emotionally Abused:   Marland Kitchen Physically Abused:   . Sexually Abused:      Current Outpatient Medications:  .  medroxyPROGESTERone (DEPO-PROVERA) 150 MG/ML injection, Inject 1 mL (150 mg total) into the muscle every 3 (three) months., Disp: 1 mL, Rfl: 3     ROS:  Review of Systems  Constitutional: Positive for fatigue. Negative for fever and unexpected weight change.  Respiratory: Negative for cough, shortness of breath and wheezing.   Cardiovascular: Negative for chest pain, palpitations and leg swelling.  Gastrointestinal: Negative for blood in  stool, constipation, diarrhea, nausea and vomiting.  Endocrine: Negative for cold intolerance, heat intolerance and polyuria.  Genitourinary: Negative for dyspareunia, dysuria, flank pain, frequency, genital sores, hematuria, menstrual problem, pelvic pain, urgency, vaginal bleeding, vaginal discharge and vaginal pain.  Musculoskeletal: Negative for back pain, joint swelling and myalgias.  Skin: Negative for rash.  Neurological: Negative for dizziness, syncope, light-headedness, numbness and headaches.  Hematological: Negative for adenopathy.  Psychiatric/Behavioral: Positive for dysphoric mood. Negative for agitation, confusion, sleep disturbance and suicidal ideas. The patient is not nervous/anxious.    BREAST: No  symptoms   Objective: BP 110/80   Ht 5\' 2"  (1.575 m)   Wt 186 lb (84.4 kg)   BMI 34.02 kg/m    Physical Exam Constitutional:      Appearance: She is well-developed.  Genitourinary:     Vulva, vagina, cervix, uterus, right adnexa and left adnexa normal.     No vulval lesion or tenderness noted.     No vaginal discharge, erythema or tenderness.     No cervical polyp.     Uterus is not enlarged or tender.     No right or left adnexal mass present.     Right adnexa not tender.     Left adnexa not tender.  Neck:     Thyroid: No thyromegaly.  Cardiovascular:     Rate and Rhythm: Normal rate and regular rhythm.     Heart sounds: Normal heart sounds. No murmur.  Pulmonary:     Effort: Pulmonary effort is normal.     Breath sounds: Normal breath sounds.  Chest:     Breasts:        Right: No mass, nipple discharge, skin change or tenderness.        Left: No mass, nipple discharge, skin change or tenderness.  Abdominal:     Palpations: Abdomen is soft.     Tenderness: There is no abdominal tenderness. There is no guarding.  Musculoskeletal:        General: Normal range of motion.     Cervical back: Normal range of motion.  Neurological:     General: No focal deficit present.     Mental Status: She is alert and oriented to person, place, and time.     Cranial Nerves: No cranial nerve deficit.  Skin:    General: Skin is warm and dry.  Psychiatric:        Mood and Affect: Mood normal.        Behavior: Behavior normal.        Thought Content: Thought content normal.        Judgment: Judgment normal.  Vitals reviewed.     Assessment/Plan: Encounter for annual routine gynecological examination  Cervical cancer screening - Plan: Cytology - PAP  Screening for STD (sexually transmitted disease) - Plan: Cytology - PAP  Screening for HPV (human papillomavirus) - Plan: Cytology - PAP  Encounter for surveillance of injectable contraceptive - Plan: medroxyPROGESTERone  (DEPO-PROVERA) 150 MG/ML injection; Depo RF   Meds ordered this encounter  Medications  . medroxyPROGESTERone (DEPO-PROVERA) 150 MG/ML injection    Sig: Inject 1 mL (150 mg total) into the muscle every 3 (three) months.    Dispense:  1 mL    Refill:  3    Order Specific Question:   Supervising Provider    Answer:   Gae Dry [[id number]]             GYN counsel adequate intake of calcium and vitamin D,  diet and exercise     F/U  Return in about 1 year (around 10/02/2020) for needs RN depo appt.  Favor Kreh B. Jaymarie Yeakel, PA-C 10/03/2019 12:11 PM

## 2019-10-03 ENCOUNTER — Other Ambulatory Visit (HOSPITAL_COMMUNITY)
Admission: RE | Admit: 2019-10-03 | Discharge: 2019-10-03 | Disposition: A | Discharge status: Home / Self Care | Payer: 59 | Source: Ambulatory Visit | Attending: Obstetrics and Gynecology | Admitting: Obstetrics and Gynecology

## 2019-10-03 ENCOUNTER — Encounter: Payer: Self-pay | Admitting: Obstetrics and Gynecology

## 2019-10-03 ENCOUNTER — Ambulatory Visit (INDEPENDENT_AMBULATORY_CARE_PROVIDER_SITE_OTHER): Payer: 59 | Admitting: Obstetrics and Gynecology

## 2019-10-03 ENCOUNTER — Other Ambulatory Visit: Payer: Self-pay

## 2019-10-03 VITALS — BP 110/80 | Ht 62.0 in | Wt 186.0 lb

## 2019-10-03 DIAGNOSIS — Z1151 Encounter for screening for human papillomavirus (HPV): Secondary | ICD-10-CM | POA: Diagnosis present

## 2019-10-03 DIAGNOSIS — Z01419 Encounter for gynecological examination (general) (routine) without abnormal findings: Secondary | ICD-10-CM

## 2019-10-03 DIAGNOSIS — Z124 Encounter for screening for malignant neoplasm of cervix: Secondary | ICD-10-CM | POA: Diagnosis not present

## 2019-10-03 DIAGNOSIS — Z0000 Encounter for general adult medical examination without abnormal findings: Secondary | ICD-10-CM | POA: Diagnosis not present

## 2019-10-03 DIAGNOSIS — Z113 Encounter for screening for infections with a predominantly sexual mode of transmission: Secondary | ICD-10-CM

## 2019-10-03 DIAGNOSIS — Z3042 Encounter for surveillance of injectable contraceptive: Secondary | ICD-10-CM

## 2019-10-03 MED ORDER — MEDROXYPROGESTERONE ACETATE 150 MG/ML IM SUSP: 150.0000 mg | INTRAMUSCULAR | 3 refills | Status: DC

## 2019-10-03 NOTE — Patient Instructions (Signed)
I value your feedback and entrusting us with your care. If you get a [facility] patient survey, I would appreciate you taking the time to let us know about your experience today. Thank you!  As of June 26, 2019, your lab results will be released to your MyChart immediately, before I even have a chance to see them. Please give me time to review them and contact you if there are any abnormalities. Thank you for your patience.  

## 2019-10-06 LAB — CYTOLOGY - PAP
Adequacy: ABSENT
Chlamydia: NEGATIVE
Comment: NEGATIVE
Comment: NEGATIVE
Comment: NORMAL
Diagnosis: NEGATIVE
High risk HPV: NEGATIVE
Neisseria Gonorrhea: NEGATIVE

## 2019-10-10 ENCOUNTER — Ambulatory Visit: Payer: 59

## 2019-10-13 ENCOUNTER — Ambulatory Visit: Payer: 59

## 2019-11-12 ENCOUNTER — Encounter: Payer: Self-pay | Admitting: Emergency Medicine

## 2019-11-12 ENCOUNTER — Emergency Department
Admission: EM | Admit: 2019-11-12 | Discharge: 2019-11-12 | Disposition: A | Discharge status: Home / Self Care | Payer: 59 | Attending: Emergency Medicine | Admitting: Emergency Medicine

## 2019-11-12 ENCOUNTER — Other Ambulatory Visit: Payer: Self-pay

## 2019-11-12 DIAGNOSIS — R519 Headache, unspecified: Secondary | ICD-10-CM | POA: Diagnosis present

## 2019-11-12 DIAGNOSIS — G44209 Tension-type headache, unspecified, not intractable: Secondary | ICD-10-CM | POA: Insufficient documentation

## 2019-11-12 MED ORDER — ACETAMINOPHEN 325 MG PO TABS
650.0000 mg | ORAL_TABLET | Freq: Once | ORAL | Status: AC
  Administered 2019-11-12: 650 mg via ORAL
  Filled 2019-11-12: qty 2

## 2019-11-12 MED ORDER — ONDANSETRON 4 MG PO TBDP
4.0000 mg | ORAL_TABLET | Freq: Once | ORAL | Status: AC
  Administered 2019-11-12: 4 mg via ORAL
  Filled 2019-11-12: qty 1

## 2019-11-12 MED ORDER — BUTALBITAL-APAP-CAFFEINE 50-325-40 MG PO TABS: 1.0000 | ORAL_TABLET | Freq: Four times a day (QID) | ORAL | 0 refills | Status: AC | PRN

## 2019-11-12 MED ORDER — KETOROLAC TROMETHAMINE 30 MG/ML IJ SOLN
30.0000 mg | Freq: Once | INTRAMUSCULAR | Status: AC
  Administered 2019-11-12: 09:00:00 30 mg via INTRAMUSCULAR
  Filled 2019-11-12: qty 1

## 2019-11-12 NOTE — ED Provider Notes (Signed)
[facility] Emergency Department Provider Note   ____________________________________________    I have reviewed the triage vital signs and the nursing notes.   HISTORY  Chief Complaint Headache     HPI Alexis Elliott is a 29 y.o. female with history as noted below who presents with complaints of intermittent headaches over the last several weeks.  She thinks these are related to stress at work.  She denies neuro deficits.  No fevers chills or neck pain.  No nausea or vomiting.  Headaches are improving with Excedrin.  No head trauma or injury.  No change in vision.  Today she reports headache only briefly improved with Excedrin.  She thinks that she needs some rest from work to improve  Past Medical History:  Diagnosis Date  . Sickle cell trait [facility])     Patient Active Problem List   Diagnosis Date Noted  . Surveillance for Depo-Provera contraception 01/23/2019    Past Surgical History:  Procedure Laterality Date  . FINGER SURGERY      Prior to Admission medications   Medication Sig Start Date End Date Taking? Authorizing Provider  butalbital-acetaminophen-caffeine (FIORICET) 50-325-40 MG tablet Take 1-2 tablets by mouth every 6 (six) hours as needed for headache. 11/12/19 11/11/20  Jene Every, MD  medroxyPROGESTERone (DEPO-PROVERA) 150 MG/ML injection Inject 1 mL (150 mg total) into the muscle every 3 (three) months. 10/03/19   Copland, Ilona Sorrel, PA-C     Allergies Patient has no known allergies.  Family History  Problem Relation Age of Onset  . Sickle cell trait Father   . Diabetes Maternal Grandmother   . Hypertension Maternal Grandmother   . Diabetes Paternal Grandmother     Social History Social History   Tobacco Use  . Smoking status: Never Smoker  . Smokeless tobacco: Never Used  Substance Use Topics  . Alcohol use: No  . Drug use: Yes    Types: Marijuana    Review of Systems  Constitutional: No  fever/chills Eyes: No visual changes.  ENT: No sore throat. Cardiovascular: Denies chest pain. Respiratory: Denies shortness of breath. Gastrointestinal: No abdominal pain.  No nausea, no vomiting.   Genitourinary: Negative for dysuria. Musculoskeletal: Negative for back pain. Skin: Negative for rash. Neurological: Negative for headaches or weakness   ____________________________________________   PHYSICAL EXAM:  VITAL SIGNS: ED Triage Vitals  Enc Vitals Group     BP 11/12/19 0829 (!) 149/81     Pulse Rate 11/12/19 0829 (!) 104     Resp 11/12/19 0829 18     Temp 11/12/19 0829 98.7 F (37.1 C)     Temp src --      SpO2 11/12/19 0829 99 %     Weight 11/12/19 0827 84.4 kg (186 lb 1.1 oz)     Height 11/12/19 0827 1.575 m (5\' 2" )     Head Circumference --      Peak Flow --      Pain Score 11/12/19 0826 8     Pain Loc --      Pain Edu? --      Excl. in GC? --     Constitutional: Alert and oriented. No acute distress.  Eyes: Conjunctivae are normal.  PERRLA, EOMI Head: Atraumatic.  Mouth/Throat: Mucous membranes are moist.   Neck:  Painless ROM Cardiovascular: Normal rate, regular rhythm.  Good peripheral circulation. Respiratory: Normal respiratory effort.  No retractions. Gastrointestinal: Soft and nontender. No distention.   Musculoskeletal: No lower extremity tenderness  nor edema.  Warm and well perfused Neurologic:  Normal speech and language. No gross focal neurologic deficits are appreciated.  Cranial nerves II to XII are normal Skin:  Skin is warm, dry and intact. No rash noted. Psychiatric: Mood and affect are normal. Speech and behavior are normal.  ____________________________________________   LABS (all labs ordered are listed, but only abnormal results are displayed)  Labs Reviewed - No data to  display ____________________________________________  EKG  None ____________________________________________  RADIOLOGY   ____________________________________________   PROCEDURES  Procedure(s) performed: No  Procedures   Critical Care performed: No ____________________________________________   INITIAL IMPRESSION / ASSESSMENT AND PLAN / ED COURSE  Pertinent labs & imaging results that were available during my care of the patient were reviewed by me and considered in my medical decision making (see chart for details).  Patient well-appearing and in no acute distress.  Reassuring neuro exam and symptoms appear to be mild to moderate but not severe.  No concerning findings on exam or HPI.  Treated with IM Toradol, p.o. Zofran with significant improvement in headache.  Will provide work note, Rx for Fioricet, outpatient follow-up with neuro if headaches continue.  Return precautions discussed    ____________________________________________   FINAL CLINICAL IMPRESSION(S) / ED DIAGNOSES  Final diagnoses:  Tension headache        Note:  This document was prepared using Dragon voice recognition software and may include unintentional dictation errors.   Lavonia Drafts, MD 11/12/19 [phone]

## 2019-11-12 NOTE — ED Triage Notes (Signed)
C/O intermittent headaches x 2-3 weeks.  Describes pain as generalized and throbbing.  AAOx3.  Skin warm and dry. NAD.  MAE equally and strong.  Gait steady.

## 2019-11-12 NOTE — ED Notes (Signed)
RN Kelly informed of pt in room °

## 2019-12-17 ENCOUNTER — Other Ambulatory Visit: Payer: Self-pay

## 2019-12-17 ENCOUNTER — Encounter: Payer: Self-pay | Admitting: Emergency Medicine

## 2019-12-17 ENCOUNTER — Emergency Department: Payer: 59

## 2019-12-17 DIAGNOSIS — R1030 Lower abdominal pain, unspecified: Secondary | ICD-10-CM | POA: Insufficient documentation

## 2019-12-17 DIAGNOSIS — Z5321 Procedure and treatment not carried out due to patient leaving prior to being seen by health care provider: Secondary | ICD-10-CM | POA: Diagnosis not present

## 2019-12-17 DIAGNOSIS — N939 Abnormal uterine and vaginal bleeding, unspecified: Secondary | ICD-10-CM | POA: Diagnosis not present

## 2019-12-17 DIAGNOSIS — R112 Nausea with vomiting, unspecified: Secondary | ICD-10-CM | POA: Insufficient documentation

## 2019-12-17 LAB — URINALYSIS, COMPLETE (UACMP) WITH MICROSCOPIC
Bacteria, UA: NONE SEEN
Bilirubin Urine: NEGATIVE
Glucose, UA: NEGATIVE mg/dL
Ketones, ur: NEGATIVE mg/dL
Nitrite: NEGATIVE
Protein, ur: 100 mg/dL — AB
RBC / HPF: >50 RBC/hpf — ABNORMAL HIGH (ref 0–5)
Specific Gravity, Urine: 1.021 (ref 1.005–1.030)
WBC, UA: >50 WBC/hpf — ABNORMAL HIGH (ref 0–5)
pH: 6 (ref 5.0–8.0)

## 2019-12-17 LAB — CBC
HCT: 36.4 % (ref 36.0–46.0)
Hemoglobin: 11.8 g/dL — ABNORMAL LOW (ref 12.0–15.0)
MCH: 26.7 pg (ref 26.0–34.0)
MCHC: 32.4 g/dL (ref 30.0–36.0)
MCV: 82.4 fL (ref 80.0–100.0)
Platelets: 345 10*3/uL (ref 150–400)
RBC: 4.42 MIL/uL (ref 3.87–5.11)
RDW: 14.4 % (ref 11.5–15.5)
WBC: 12.5 10*3/uL — ABNORMAL HIGH (ref 4.0–10.5)
nRBC: 0 % (ref 0.0–0.2)

## 2019-12-17 LAB — COMPREHENSIVE METABOLIC PANEL
ALT: 26 U/L (ref 0–44)
AST: 25 U/L (ref 15–41)
Albumin: 3.9 g/dL (ref 3.5–5.0)
Alkaline Phosphatase: 53 U/L (ref 38–126)
Anion gap: 9 (ref 5–15)
BUN: 6 mg/dL (ref 6–20)
CO2: 25 mmol/L (ref 22–32)
Calcium: 8.6 mg/dL — ABNORMAL LOW (ref 8.9–10.3)
Chloride: 102 mmol/L (ref 98–111)
Creatinine, Ser: 0.83 mg/dL (ref 0.44–1.00)
GFR calc Af Amer: >60 mL/min (ref >60)
GFR calc non Af Amer: >60 mL/min (ref >60)
Glucose, Bld: 132 mg/dL — ABNORMAL HIGH (ref 70–99)
Potassium: 3.4 mmol/L — ABNORMAL LOW (ref 3.5–5.1)
Sodium: 136 mmol/L (ref 135–145)
Total Bilirubin: 0.7 mg/dL (ref 0.3–1.2)
Total Protein: 7.2 g/dL (ref 6.5–8.1)

## 2019-12-17 LAB — LIPASE, BLOOD: Lipase: 22 U/L (ref 11–51)

## 2019-12-17 LAB — HCG, QUANTITATIVE, PREGNANCY: hCG, Beta Chain, Quant, S: 2263 m[IU]/mL — ABNORMAL HIGH (ref <5)

## 2019-12-17 LAB — POCT PREGNANCY, URINE: Preg Test, Ur: POSITIVE — AB

## 2019-12-17 MED ORDER — OXYCODONE-ACETAMINOPHEN 5-325 MG PO TABS
1.0000 | ORAL_TABLET | ORAL | Status: DC | PRN
  Administered 2019-12-17: 1 via ORAL
  Filled 2019-12-17: qty 1

## 2019-12-17 MED ORDER — ONDANSETRON 4 MG PO TBDP
4.0000 mg | ORAL_TABLET | Freq: Once | ORAL | Status: AC | PRN
  Administered 2019-12-17: 4 mg via ORAL
  Filled 2019-12-17: qty 1

## 2019-12-17 NOTE — ED Triage Notes (Signed)
First Nurse Note:  Arrives with c/o lower abdominal cramping and vaginal bleeding.  States took an "abortion pill on Friday" ? Methotrexate.  AAOx3.  Skin warm and dry. NAD

## 2019-12-17 NOTE — ED Triage Notes (Signed)
Patient took abortion pill, methotrexate,Friday morning. Since Friday afternoon she has been having N/V, abdominal cramping, and vaginal bleeding.

## 2019-12-18 ENCOUNTER — Emergency Department
Admission: EM | Admit: 2019-12-18 | Discharge: 2019-12-18 | Disposition: A | Discharge status: Home / Self Care | Payer: 59 | Attending: Emergency Medicine | Admitting: Emergency Medicine

## 2019-12-18 DIAGNOSIS — O209 Hemorrhage in early pregnancy, unspecified: Secondary | ICD-10-CM

## 2019-12-18 LAB — URINE CULTURE: Special Requests: NORMAL

## 2019-12-18 NOTE — ED Notes (Signed)
No answer when called several times from lobby 

## 2020-06-18 ENCOUNTER — Ambulatory Visit: Payer: Medicaid Other

## 2020-07-19 DIAGNOSIS — Z20822 Contact with and (suspected) exposure to covid-19: Secondary | ICD-10-CM | POA: Diagnosis not present

## 2020-12-21 IMAGING — US US EXTREM LOW VENOUS
1 series · 14 of 24 positions shown · non-contrast
Comparison: None.

CLINICAL DATA: Elevated D-dimer.  Chest pain.

EXAM:
BILATERAL LOWER EXTREMITY VENOUS DOPPLER ULTRASOUND
TECHNIQUE: Gray-scale sonography with compression, as well as color and duplex
ultrasound, were performed to evaluate the deep venous system(s)
from the level of the common femoral vein through the popliteal and
proximal calf veins.

[Series 1: us extrem low venous · 14 of 67 slices shown]
[im 1/67]
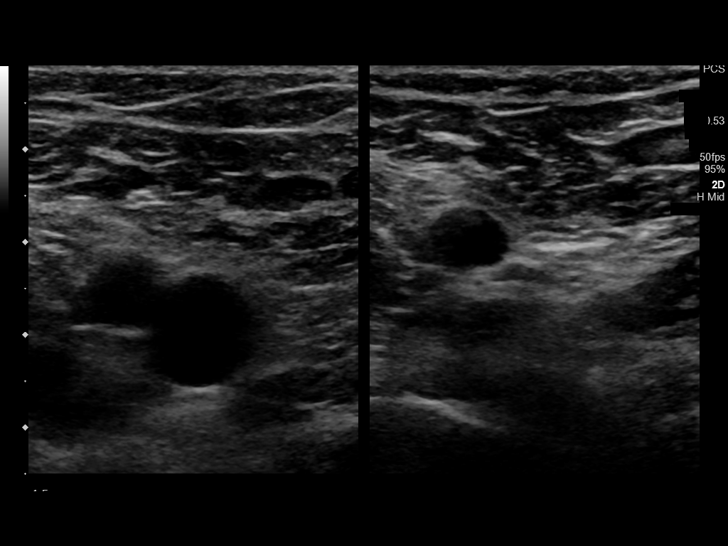
[im 6/67]
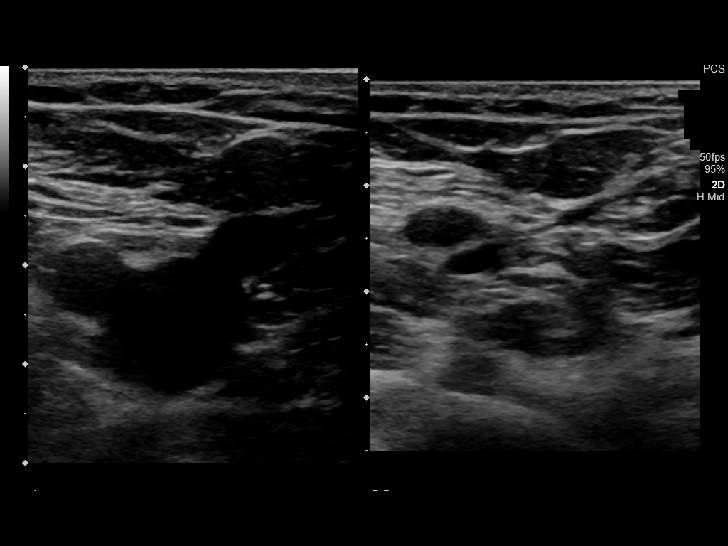
[im 12/67]
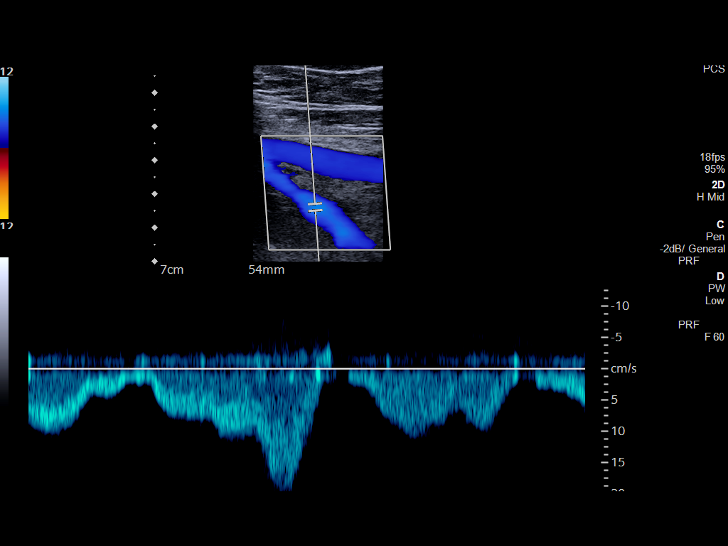
[im 18/67]
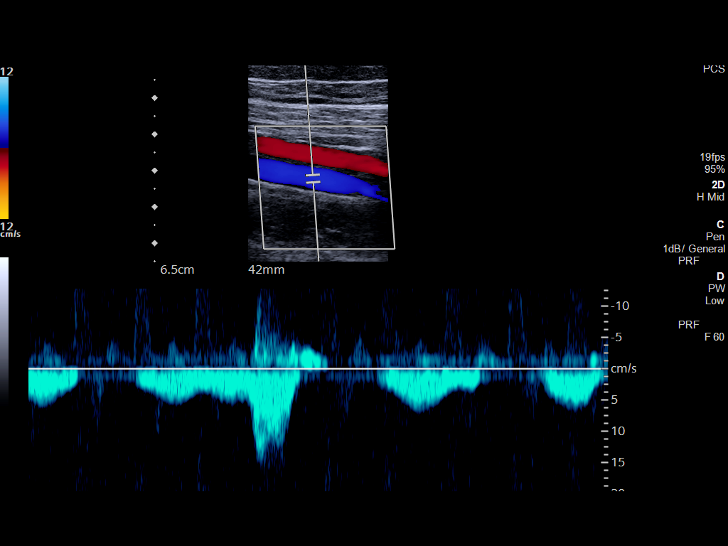
[im 21/67]
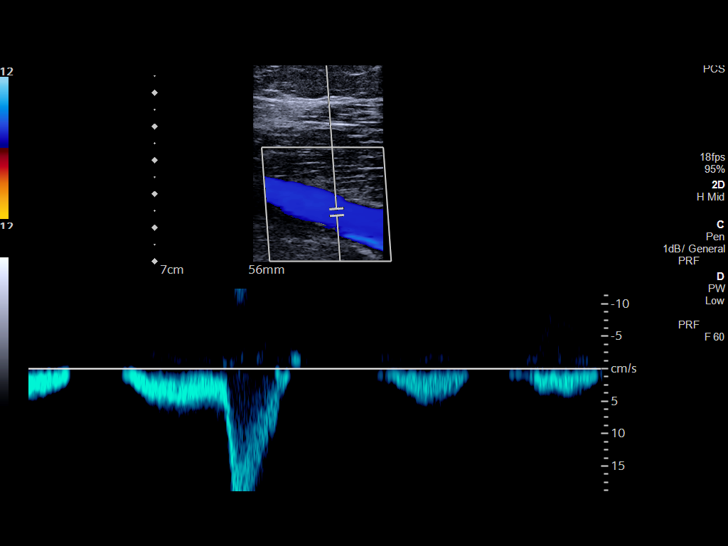
[im 26/67]
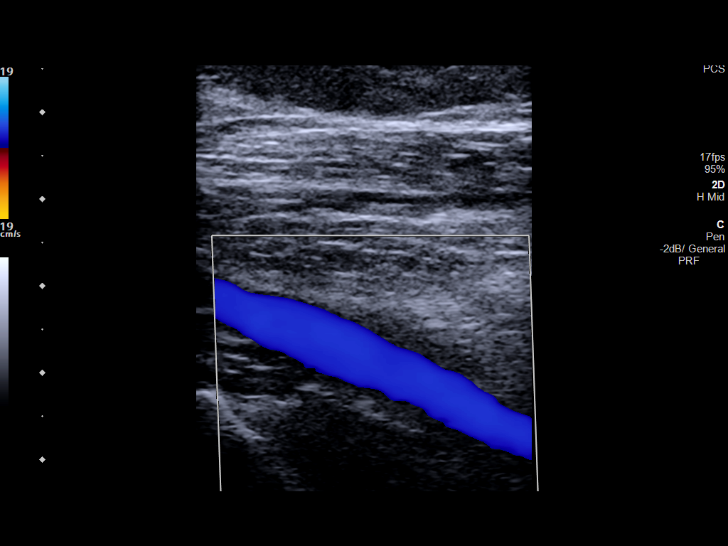
[im 32/67]
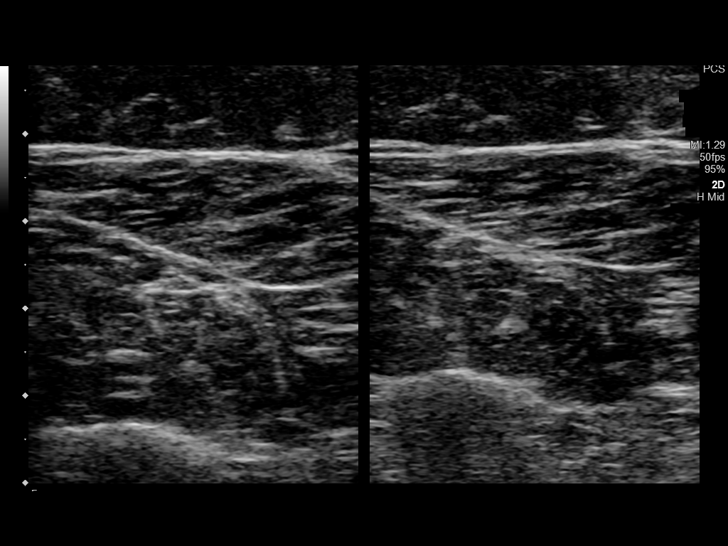
[im 35/67]
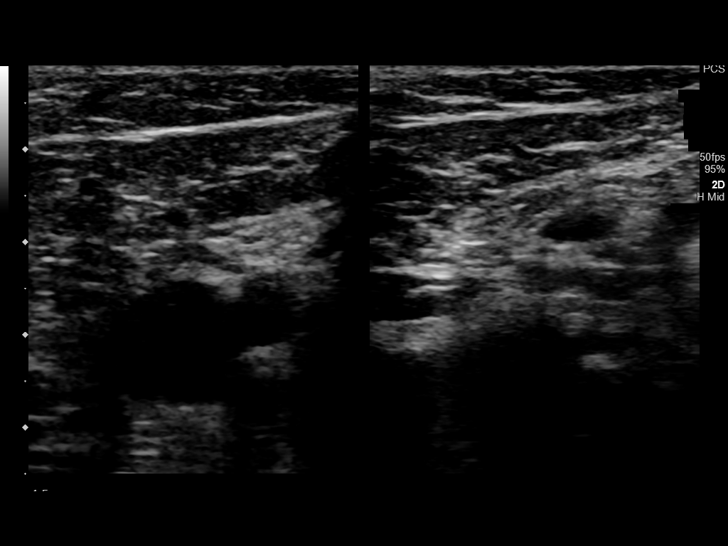
[im 41/67]
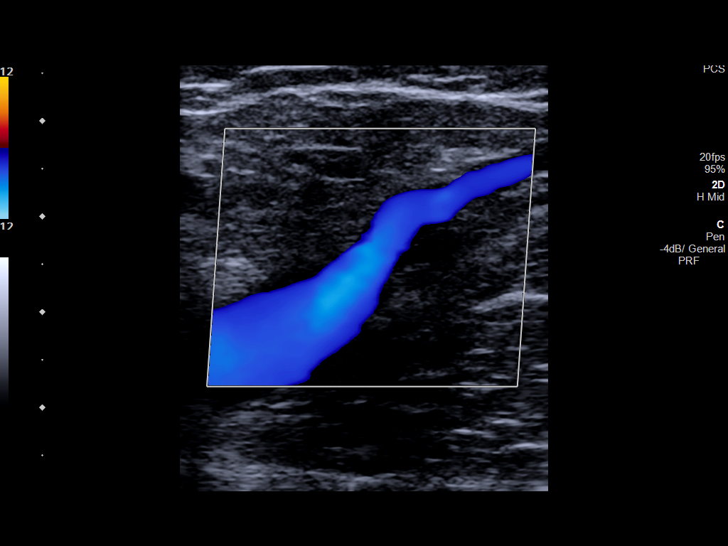
[im 46/67]
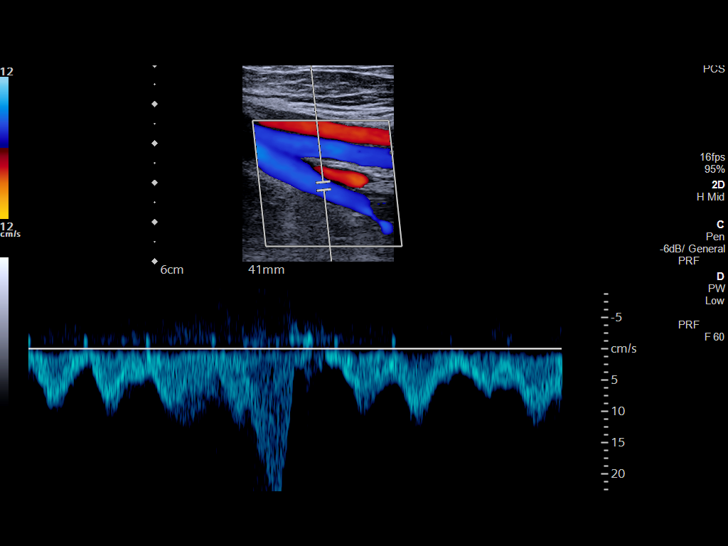
[im 52/67]
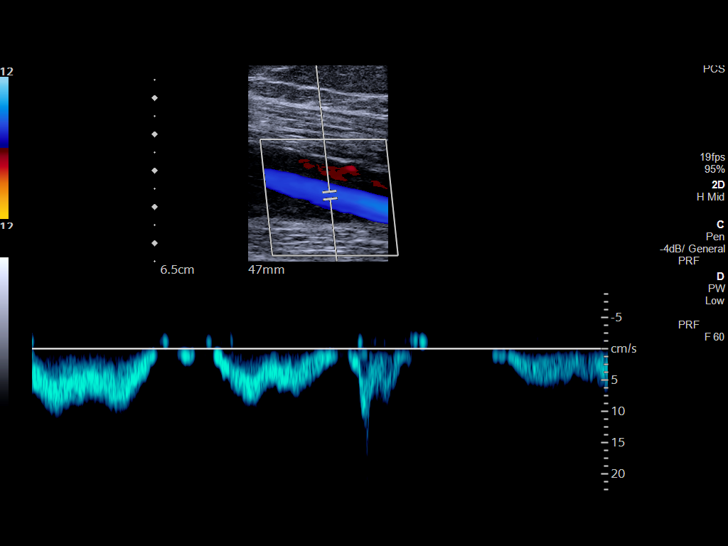
[im 55/67]
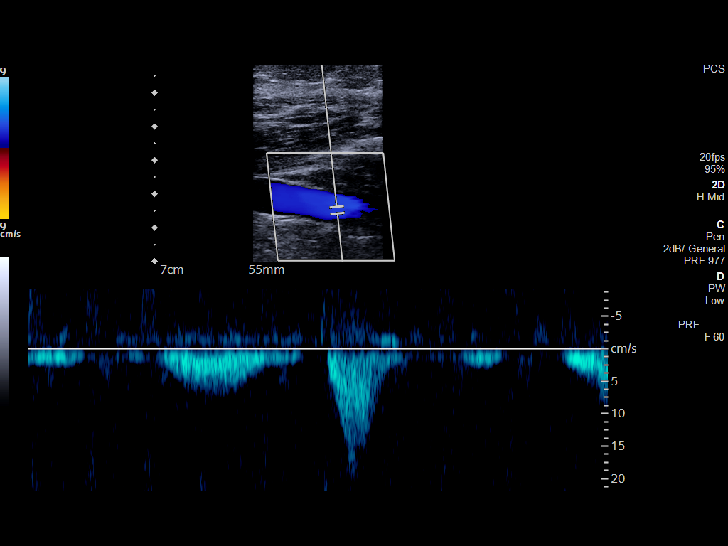
[im 61/67]
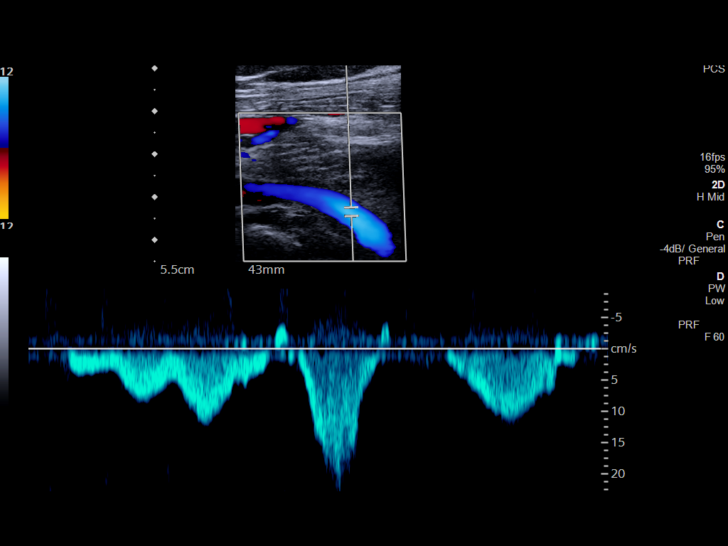
[im 67/67]
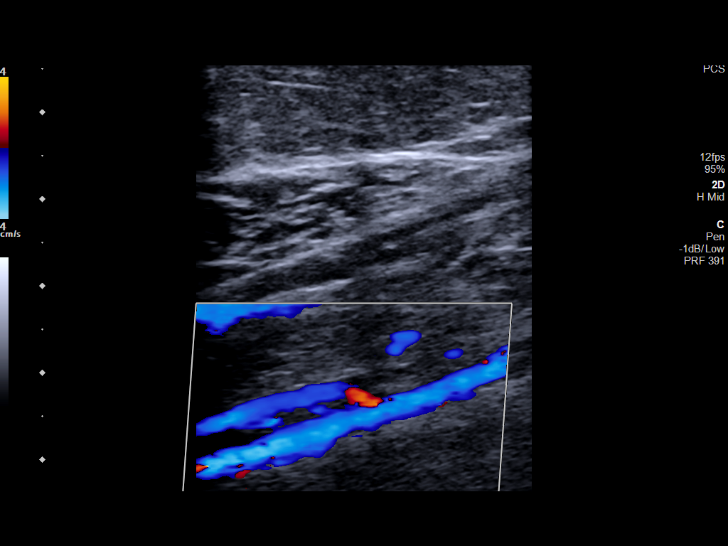

[14 of 24 positions shown; findings below may reference images not displayed]

FINDINGS: VENOUS

Normal compressibility of the common femoral, superficial femoral,
and popliteal veins, as well as the visualized calf veins.
Visualized portions of profunda femoral vein and great saphenous
vein unremarkable. No filling defects to suggest DVT on grayscale or
color Doppler imaging. Doppler waveforms show normal direction of
venous flow, normal respiratory phasicity and response to
augmentation.

OTHER

None.

Limitations: none
IMPRESSION: No femoropopliteal DVT nor evidence of DVT within the visualized
calf veins.

If clinical symptoms are inconsistent or if there are persistent or
worsening symptoms, further imaging (possibly involving the iliac
veins) may be warranted.

## 2021-08-04 ENCOUNTER — Ambulatory Visit: Payer: 59

## 2021-08-16 ENCOUNTER — Encounter: Payer: Self-pay | Admitting: Family Medicine

## 2021-08-16 ENCOUNTER — Ambulatory Visit (LOCAL_COMMUNITY_HEALTH_CENTER): Payer: 59 | Admitting: Family Medicine

## 2021-08-16 ENCOUNTER — Other Ambulatory Visit: Payer: Self-pay

## 2021-08-16 VITALS — BP 125/82 | Ht 62.0 in | Wt 171.6 lb

## 2021-08-16 DIAGNOSIS — Z30013 Encounter for initial prescription of injectable contraceptive: Secondary | ICD-10-CM

## 2021-08-16 DIAGNOSIS — Z3009 Encounter for other general counseling and advice on contraception: Secondary | ICD-10-CM | POA: Diagnosis not present

## 2021-08-16 DIAGNOSIS — Z0000 Encounter for general adult medical examination without abnormal findings: Secondary | ICD-10-CM | POA: Diagnosis not present

## 2021-08-16 DIAGNOSIS — Z113 Encounter for screening for infections with a predominantly sexual mode of transmission: Secondary | ICD-10-CM

## 2021-08-16 DIAGNOSIS — Z3202 Encounter for pregnancy test, result negative: Secondary | ICD-10-CM | POA: Diagnosis not present

## 2021-08-16 LAB — WET PREP FOR TRICH, YEAST, CLUE
Trichomonas Exam: NEGATIVE
Yeast Exam: NEGATIVE

## 2021-08-16 LAB — PREGNANCY, URINE: Preg Test, Ur: NEGATIVE

## 2021-08-16 MED ORDER — MEDROXYPROGESTERONE ACETATE 150 MG/ML IM SUSP
150.0000 mg | INTRAMUSCULAR | Status: AC
  Administered 2021-11-02: 150 mg via INTRAMUSCULAR

## 2021-08-16 NOTE — Progress Notes (Signed)
Patient here for PE,Depo, PT and STD check. Wet prep reviewed, no tx per standing orders. Condoms given, PCP list given.

## 2021-08-16 NOTE — Progress Notes (Signed)
[facility] DEPARTMENT [facility] [address] Main Number: [phone]  Family Planning Visit- Repeat Yearly Visit  Subjective:  Alexis Elliott is a 31 y.o. G3P1011  being seen today for an annual wellness visit and to discuss contraception options.   The patient is currently using No Method - No Contraceptive Precautions for pregnancy prevention. Patient does not want a pregnancy in the next year. Patient has the following medical problems: has Surveillance for Depo-Provera contraception on their problem list.  Chief Complaint  Patient presents with   Annual Exam    [facility]    Patient reports here for physical and desires to start Depo for [facility]   Patient denies any other concerns    See flowsheet for other program required questions.   Body mass index is 31.39 kg/m. - Patient is eligible for diabetes screening based on BMI and age >70?  not applicable HA1C ordered? not applicable  Patient reports 2 of partners in last year. Desires STI screening?  Yes   Has patient been screened once for HCV in the past?  No  No results found for: HCVAB  Does the patient have current of drug use, have a partner with drug use, and/or has been incarcerated since last result? Yes  If yes-- Screen for HCV through [facility]   Does the patient meet criteria for HBV testing? Yes  Criteria:  -Household, sexual or needle sharing contact with HBV -History of drug use -HIV positive -Those with known Hep C   Health Maintenance Due  Topic Date Due   Hepatitis C Screening  Never done   COVID-19 Vaccine (3 - Booster for Moderna series) 04/13/2020   INFLUENZA VACCINE  02/14/2021    Review of Systems  Constitutional:  Negative for chills, fever, malaise/fatigue and weight loss.  HENT:  Negative for congestion, hearing loss and sore throat.   Eyes:  Negative for blurred vision, double vision and photophobia.  Respiratory:  Negative for shortness of breath.    Cardiovascular:  Negative for chest pain.  Gastrointestinal:  Negative for abdominal pain, blood in stool, constipation, diarrhea, heartburn, nausea and vomiting.  Genitourinary:  Negative for dysuria and frequency.  Musculoskeletal:  Negative for back pain, joint pain and neck pain.  Skin:  Negative for itching and rash.  Neurological:  Positive for headaches. Negative for dizziness and weakness.       ~ 5 HA per month   Endo/Heme/Allergies:  Does not bruise/bleed easily.  Psychiatric/Behavioral:  Negative for depression, substance abuse and suicidal ideas.    The following portions of the patient's history were reviewed and updated as appropriate: allergies, current medications, past family history, past medical history, past social history, past surgical history and problem list. Problem list updated.  Objective:   Vitals:   08/16/21 0914  BP: 125/82  Weight: 171 lb 9.6 oz (77.8 kg)  Height: 5\' 2"  (1.575 m)    Physical Exam Vitals and nursing note reviewed.  Constitutional:      Appearance: Normal appearance.  HENT:     Head: Normocephalic and atraumatic.     Mouth/Throat:     Mouth: Mucous membranes are moist.     Dentition: Normal dentition. No dental caries.     Pharynx: No oropharyngeal exudate or posterior oropharyngeal erythema.  Eyes:     General: No scleral icterus. Neck:     Thyroid: No thyroid mass, thyromegaly or thyroid tenderness.  Cardiovascular:     Rate and Rhythm:  Normal rate and regular rhythm.     Pulses: Normal pulses.     Heart sounds: Normal heart sounds.  Pulmonary:     Effort: Pulmonary effort is normal.     Breath sounds: Normal breath sounds.  Chest:     Comments: Breasts:        Right: Normal. No swelling, mass, nipple discharge, skin change or tenderness.        Left: Normal. No swelling, mass, nipple discharge, skin change or tenderness.   Abdominal:     General: Abdomen is flat. Bowel sounds are normal.     Palpations: Abdomen is  soft.  Genitourinary:    Comments: Deferred  Musculoskeletal:        General: Deformity present. Normal range of motion.     Cervical back: Normal range of motion and neck supple.  Lymphadenopathy:     Cervical: No cervical adenopathy.  Skin:    General: Skin is warm and dry.  Neurological:     General: No focal deficit present.     Mental Status: She is alert and oriented to person, place, and time.  Psychiatric:        Behavior: Behavior normal.      Assessment and Plan:  Alexis Elliott is a 31 y.o. female G3P1011 presenting to the [facility] Department for an yearly wellness and contraception visit   1. Routine general medical examination at a health care facility Well woman physical  PAP not due 2026  CBE today  - Pregnancy, urine  2. Screening examination for venereal disease Patient accepted all screenings including wet prep, oral, vaginal CT/GC and bloodwork for HIV/RPR.   Wet prep results neg    No Treatment needed  Discussed time line for State Lab results and that patient will be called with positive results and encouraged patient to call if she had not heard in 2 weeks.   Recommended condom use with all sex  - WET PREP FOR TRICH, YEAST, CLUE - HIV [facility] - Syphilis Serology, [facility] - Chlamydia/Gonorrhea [facility] - Gonococcus culture  3. Encounter for initial prescription of injectable contraceptive Pt desires DMPA  DMPA 150 mg IM every 11-13 weeks x 1 year.   - medroxyPROGESTERone (DEPO-PROVERA) injection 150 mg  4. Family planning counseling   Contraception counseling: Reviewed all forms of birth control options in the tiered based approach. available including abstinence; over the counter/barrier methods; hormonal contraceptive medication including pill, patch, ring, injection,contraceptive implant, ECP; hormonal and nonhormonal IUDs; permanent sterilization options including vasectomy and the various tubal sterilization  modalities. Risks, benefits, and typical effectiveness rates were reviewed.  Questions were answered.  Written information was also given to the patient to review.  Patient desires depo Provera, this was prescribed for patient. She will follow up in  11-13 weeks  for surveillance.    Pt was told to call with any further questions, or with any concerns about this method of contraception.  Emphasized use of condoms 100% of the time for STI prevention.  Patient was offered ECP based on Unprotected sex within past 72 hours. ECP was not accepted by the patient. ECP counseling was not given - see RN documentation   Return in 3 months (on 11/13/2021), or 11-13 weeks, for depo.  No future appointments.  Wendi Snipes, FNP

## 2021-08-16 NOTE — Progress Notes (Signed)
Depo 150 mg given IM in RUOQ, without any complications

## 2021-08-21 LAB — GONOCOCCUS CULTURE

## 2021-11-02 ENCOUNTER — Ambulatory Visit (LOCAL_COMMUNITY_HEALTH_CENTER): Payer: Medicaid Other

## 2021-11-02 VITALS — BP 133/89 | Ht 62.0 in | Wt 167.5 lb

## 2021-11-02 DIAGNOSIS — Z30013 Encounter for initial prescription of injectable contraceptive: Secondary | ICD-10-CM | POA: Diagnosis not present

## 2021-11-02 DIAGNOSIS — Z3042 Encounter for surveillance of injectable contraceptive: Secondary | ICD-10-CM

## 2021-11-02 DIAGNOSIS — Z3009 Encounter for other general counseling and advice on contraception: Secondary | ICD-10-CM | POA: Diagnosis not present

## 2021-11-02 NOTE — Progress Notes (Signed)
11 weeks 1 day post depo. Depo administered per order by Elveria Rising, FNP dated 08/16/2021. Tolerated well RUOQ. Next depo due 01/18/2022, has reminder. Jerel Shepherd, RN ? ?

## 2022-06-16 DIAGNOSIS — Z419 Encounter for procedure for purposes other than remedying health state, unspecified: Secondary | ICD-10-CM | POA: Diagnosis not present

## 2022-07-17 DIAGNOSIS — Z419 Encounter for procedure for purposes other than remedying health state, unspecified: Secondary | ICD-10-CM | POA: Diagnosis not present

## 2022-08-17 DIAGNOSIS — Z419 Encounter for procedure for purposes other than remedying health state, unspecified: Secondary | ICD-10-CM | POA: Diagnosis not present

## 2022-09-15 DIAGNOSIS — Z419 Encounter for procedure for purposes other than remedying health state, unspecified: Secondary | ICD-10-CM | POA: Diagnosis not present

## 2022-10-16 DIAGNOSIS — O009 Unspecified ectopic pregnancy without intrauterine pregnancy: Secondary | ICD-10-CM

## 2022-10-16 DIAGNOSIS — Z419 Encounter for procedure for purposes other than remedying health state, unspecified: Secondary | ICD-10-CM | POA: Diagnosis not present

## 2022-10-16 HISTORY — DX: Unspecified ectopic pregnancy without intrauterine pregnancy: O00.90

## 2022-11-09 ENCOUNTER — Emergency Department: Payer: Medicaid Other

## 2022-11-09 ENCOUNTER — Ambulatory Visit (INDEPENDENT_AMBULATORY_CARE_PROVIDER_SITE_OTHER)
Admission: EM | Admit: 2022-11-09 | Discharge: 2022-11-09 | Disposition: A | Discharge status: Other Acute Inpatient Hospital | Payer: Medicaid Other | Source: Home / Self Care | Attending: Family Medicine | Admitting: Family Medicine

## 2022-11-09 ENCOUNTER — Encounter: Payer: Self-pay | Admitting: Intensive Care

## 2022-11-09 ENCOUNTER — Emergency Department
Admission: EM | Admit: 2022-11-09 | Discharge: 2022-11-09 | Disposition: A | Discharge status: Home / Self Care | Payer: Medicaid Other | Attending: Emergency Medicine | Admitting: Emergency Medicine

## 2022-11-09 ENCOUNTER — Other Ambulatory Visit: Payer: Self-pay

## 2022-11-09 DIAGNOSIS — D573 Sickle-cell trait: Secondary | ICD-10-CM | POA: Diagnosis not present

## 2022-11-09 DIAGNOSIS — Z3201 Encounter for pregnancy test, result positive: Secondary | ICD-10-CM | POA: Insufficient documentation

## 2022-11-09 DIAGNOSIS — O139 Gestational [pregnancy-induced] hypertension without significant proteinuria, unspecified trimester: Secondary | ICD-10-CM | POA: Diagnosis not present

## 2022-11-09 DIAGNOSIS — O3680X0 Pregnancy with inconclusive fetal viability, not applicable or unspecified: Secondary | ICD-10-CM

## 2022-11-09 DIAGNOSIS — O0091 Unspecified ectopic pregnancy with intrauterine pregnancy: Secondary | ICD-10-CM | POA: Insufficient documentation

## 2022-11-09 DIAGNOSIS — O26851 Spotting complicating pregnancy, first trimester: Secondary | ICD-10-CM | POA: Insufficient documentation

## 2022-11-09 DIAGNOSIS — O209 Hemorrhage in early pregnancy, unspecified: Secondary | ICD-10-CM | POA: Diagnosis not present

## 2022-11-09 DIAGNOSIS — O4690 Antepartum hemorrhage, unspecified, unspecified trimester: Secondary | ICD-10-CM

## 2022-11-09 DIAGNOSIS — O00102 Left tubal pregnancy without intrauterine pregnancy: Secondary | ICD-10-CM | POA: Diagnosis not present

## 2022-11-09 DIAGNOSIS — N939 Abnormal uterine and vaginal bleeding, unspecified: Secondary | ICD-10-CM | POA: Insufficient documentation

## 2022-11-09 DIAGNOSIS — Z3A01 Less than 8 weeks gestation of pregnancy: Secondary | ICD-10-CM | POA: Insufficient documentation

## 2022-11-09 DIAGNOSIS — D72829 Elevated white blood cell count, unspecified: Secondary | ICD-10-CM | POA: Insufficient documentation

## 2022-11-09 LAB — COMPREHENSIVE METABOLIC PANEL
ALT: 31 U/L (ref 0–44)
AST: 28 U/L (ref 15–41)
Albumin: 4.1 g/dL (ref 3.5–5.0)
Alkaline Phosphatase: 63 U/L (ref 38–126)
Anion gap: 8 (ref 5–15)
BUN: 9 mg/dL (ref 6–20)
CO2: 23 mmol/L (ref 22–32)
Calcium: 9 mg/dL (ref 8.9–10.3)
Chloride: 104 mmol/L (ref 98–111)
Creatinine, Ser: 0.83 mg/dL (ref 0.44–1.00)
GFR, Estimated: >60 mL/min (ref >60)
Glucose, Bld: 120 mg/dL — ABNORMAL HIGH (ref 70–99)
Potassium: 3.7 mmol/L (ref 3.5–5.1)
Sodium: 135 mmol/L (ref 135–145)
Total Bilirubin: 1 mg/dL (ref 0.3–1.2)
Total Protein: 8 g/dL (ref 6.5–8.1)

## 2022-11-09 LAB — CBC WITH DIFFERENTIAL/PLATELET
Abs Immature Granulocytes: 0.05 10*3/uL (ref 0.00–0.07)
Basophils Absolute: 0.1 10*3/uL (ref 0.0–0.1)
Basophils Relative: 0 %
Eosinophils Absolute: 0 10*3/uL (ref 0.0–0.5)
Eosinophils Relative: 0 %
HCT: 40.1 % (ref 36.0–46.0)
Hemoglobin: 12.7 g/dL (ref 12.0–15.0)
Immature Granulocytes: 0 %
Lymphocytes Relative: 21 %
Lymphs Abs: 2.4 10*3/uL (ref 0.7–4.0)
MCH: 27.5 pg (ref 26.0–34.0)
MCHC: 31.7 g/dL (ref 30.0–36.0)
MCV: 86.8 fL (ref 80.0–100.0)
Monocytes Absolute: 0.8 10*3/uL (ref 0.1–1.0)
Monocytes Relative: 7 %
Neutro Abs: 8 10*3/uL — ABNORMAL HIGH (ref 1.7–7.7)
Neutrophils Relative %: 72 %
Platelets: 309 10*3/uL (ref 150–400)
RBC: 4.62 MIL/uL (ref 3.87–5.11)
RDW: 14.3 % (ref 11.5–15.5)
WBC: 11.3 10*3/uL — ABNORMAL HIGH (ref 4.0–10.5)
nRBC: 0 % (ref 0.0–0.2)

## 2022-11-09 LAB — URINALYSIS, ROUTINE W REFLEX MICROSCOPIC
Bilirubin Urine: NEGATIVE
Glucose, UA: NEGATIVE mg/dL
Ketones, ur: NEGATIVE mg/dL
Leukocytes,Ua: NEGATIVE
Nitrite: NEGATIVE
Protein, ur: 100 mg/dL — AB
Specific Gravity, Urine: 1.028 (ref 1.005–1.030)
pH: 6 (ref 5.0–8.0)

## 2022-11-09 LAB — ABO/RH: ABO/RH(D): A POS

## 2022-11-09 LAB — POC URINE PREG, ED: Preg Test, Ur: POSITIVE — AB

## 2022-11-09 LAB — PREGNANCY, URINE: Preg Test, Ur: POSITIVE — AB

## 2022-11-09 LAB — LIPASE, BLOOD: Lipase: 27 U/L (ref 11–51)

## 2022-11-09 LAB — HCG, QUANTITATIVE, PREGNANCY: hCG, Beta Chain, Quant, S: 2083 m[IU]/mL — ABNORMAL HIGH (ref <5)

## 2022-11-09 NOTE — ED Provider Notes (Signed)
[facility]    CSN: [id number] Arrival date & time: 11/09/22  0841      History   Chief Complaint Chief Complaint  Patient presents with   Dizziness   Abdominal Cramping   Diarrhea         HPI Alexis Elliott is a 32 y.o. female.   HPI   Alexis Elliott presents for dizziness, vomiting, lower abdominal cramping, diarrhea, heavy vaginal bleeding and back pain. She had an episode of vomiting this morning and 4 episodes yesterday. Patient's last menstrual period was 09/28/2022. Says this was a normal period for her in flow and duration. Denies chest pain, shortness of breath, fever, cough, rhinorrhea and sore throat. She drank some ginger ale and they gave her some Zofran at work.     Past Medical History:  Diagnosis Date   Sickle cell trait     Patient Active Problem List   Diagnosis Date Noted   Surveillance for Depo-Provera contraception 01/23/2019    Past Surgical History:  Procedure Laterality Date   FINGER SURGERY      OB History     Gravida  3   Para  1   Term  1   Preterm  0   AB  1   Living  1      SAB  0   IAB  1   Ectopic      Multiple      Live Births  1            Home Medications    Prior to Admission medications   Medication Sig Start Date End Date Taking? Authorizing Provider  medroxyPROGESTERone (DEPO-PROVERA) 150 MG/ML injection Inject 1 mL (150 mg total) into the muscle every 3 (three) months. Patient not taking: Reported on 08/16/2021 10/03/19   Copland, Ilona Sorrel, PA-C    Family History Family History  Problem Relation Age of Onset   Hypertension Paternal Grandfather    Hypertension Paternal Grandmother    Diabetes Paternal Grandmother    Diabetes Maternal Grandmother    Hypertension Maternal Grandmother    Sickle cell trait Father     Social History Social History   Tobacco Use   Smoking status: Never   Smokeless tobacco: Never  Vaping Use   Vaping Use: Never used  Substance Use Topics    Alcohol use: No    Comment: social drinker   Drug use: Yes    Frequency: 2.0 times per week    Types: Marijuana     Allergies   Patient has no known allergies.   Review of Systems Review of Systems: negative unless otherwise stated in HPI.      Physical Exam Triage Vital Signs ED Triage Vitals  Enc Vitals Group     BP --      Pulse Rate 11/09/22 0858 92     Resp 11/09/22 0858 16     Temp 11/09/22 0858 98.6 F (37 C)     Temp Source 11/09/22 0858 Oral     SpO2 11/09/22 0858 98 %     Weight 11/09/22 0858 180 lb (81.6 kg)     Height 11/09/22 0858  (1.575 m)     Head Circumference --      Peak Flow --      Pain Score 11/09/22 0909 8     Pain Loc --      Pain Edu? --      Excl. in GC? --    Orthostatic  VS for the past 24 hrs:  BP- Lying Pulse- Lying BP- Sitting Pulse- Sitting  11/09/22 0911 133/81 98 136/83 107    Updated Vital Signs Pulse 92   Temp 98.6 F (37 C) (Oral)   Resp 16   Ht  (1.575 m)   Wt 81.6 kg   LMP 11/08/2022   SpO2 98%   BMI 32.92 kg/m   Visual Acuity Right Eye Distance:   Left Eye Distance:   Bilateral Distance:    Right Eye Near:   Left Eye Near:    Bilateral Near:     Physical Exam GEN:     alert, well appearing female and no distress    HENT:  mucus membranes moist, oropharyngeal without lesions or erythema,  nares patent, no nasal discharge  EYES:   pupils equal and reactive, EOM intact NECK:  supple, normal ROM RESP:  clear to auscultation bilaterally, no increased work of breathing  CVS:   regular rate and rhythm,  distal pulses intact  ABD:  soft, non-tender; bowel sounds present; no palpable masses EXT:   normal ROM, atraumatic NEURO:  normal without focal findings,  speech normal, alert and oriented   Skin:   warm and dry,  normal skin turgor Psych: Normal affect, appropriate speech and behavior      UC Treatments / Results  Labs (all labs ordered are listed, but only abnormal results are  displayed) Labs Reviewed  PREGNANCY, URINE - Abnormal; Notable for the following components:      Result Value   Preg Test, Ur POSITIVE (*)    All other components within normal limits  COMPREHENSIVE METABOLIC PANEL  LIPASE, BLOOD  CBC WITH DIFFERENTIAL/PLATELET    EKG  If EKG performed, see my interpretation in the MDM section  Radiology No results found.   Procedures Procedures (including critical care time)  Medications Ordered in UC Medications - No data to display  Orthostatic VS for the past 24 hrs (Last 3 readings):  BP- Lying Pulse- Lying BP- Sitting Pulse- Sitting BP- Standing at 3 minutes Pulse- Standing at 3 minutes  11/09/22 0911 133/81 98 136/83 107 127/84 103      Initial Impression / Assessment and Plan / UC Course  I have reviewed the triage vital signs and the nursing notes.  Pertinent labs & imaging results that were available during my care of the patient were reviewed by me and considered in my medical decision making (see chart for details).       Patient is a 32 y.o. female  who presents for dizziness, abdominal pain and vaginal bleeding.  Overall patient is well-appearing and afebrile.  Vital signs stable. Orthostatic vitals are unremarkable. Ordered urine pregnancy, CBC, CMP and lipase to evaluate for infectious vs metabolic process.  EKG personally interpreted by me; NSR without acute ST or T wave changes, normal QR interval and no QT prolongation.   Urine pregnancy is positive.  Pt shocked by results. Pt broke up with her partner yesterday.  Reports her last 2 periods were normal in length and duration. She has regular monthly periods.  She is [id number].  She has a 80 yo daughter. Recommended ED evaluation for threatened pregnancy in light of new onset heavy menstrual bleeding. Pt stable to travel to the ED via private vehicle. Discussed MDM, treatment plan and plan for follow-up with patient who agrees with plan.   Final Clinical Impressions(s) /  UC Diagnoses   Final diagnoses:  Positive pregnancy test  Vaginal  bleeding     Discharge Instructions      Your pregnancy test is positive and you are having vaginal bleeding.  Go to the emergency department for further evaluation.      ED Prescriptions   None    PDMP not reviewed this encounter.   Katha Cabal, DO 11/09/22 1006

## 2022-11-09 NOTE — Discharge Instructions (Addendum)
Your pregnancy test is positive and you are having vaginal bleeding.  Go to the emergency department for further evaluation.

## 2022-11-09 NOTE — ED Provider Notes (Signed)
[facility] Provider Note    Event Date/Time   First MD Initiated Contact with Patient 11/09/22 1213     (approximate)   History   Vaginal Bleeding   HPI  Alexis Elliott is a 32 y.o. female G5 P1 who presents today with vaginal bleeding, lower abdominal cramping, nausea and vomiting for the past 2 days.  Patient reports her last menstrual period was 3/15 through 3/19.  She reports that her cramping has improved today.  She went to an urgent care, and they did a pregnancy test which was positive.  Patient reports that she is very surprised by these results.  No fevers or chills.  No other vaginal discharge.  She reports that she went through 4 pads of bleeding yesterday, and 2 today.  No clots.  She reports that her cramping has resolved.  No dysuria.  Patient Active Problem List   Diagnosis Date Noted   Surveillance for Depo-Provera contraception 01/23/2019          Physical Exam   Triage Vital Signs: ED Triage Vitals  Enc Vitals Group     BP 11/09/22 1203 139/84     Pulse Rate 11/09/22 1203 (!) 110     Resp 11/09/22 1203 16     Temp 11/09/22 1203 98.4 F (36.9 C)     Temp Source 11/09/22 1203 Oral     SpO2 11/09/22 1203 95 %     Weight 11/09/22 1204 180 lb (81.6 kg)     Height 11/09/22 1204  (1.575 m)     Head Circumference --      Peak Flow --      Pain Score 11/09/22 1204 0     Pain Loc --      Pain Edu? --      Excl. in GC? --     Most recent vital signs: Vitals:   11/09/22 1203 11/09/22 1500  BP: 139/84 (!) 154/91  Pulse: (!) 110 88  Resp: 16 18  Temp: 98.4 F (36.9 C)   SpO2: 95% 97%    Physical Exam Vitals and nursing note reviewed.  Constitutional:      General: Awake and alert. No acute distress.    Appearance: Normal appearance. The patient is normal weight.  HENT:     Head: Normocephalic and atraumatic.     Mouth: Mucous membranes are moist.  Eyes:     General: PERRL. Normal EOMs        Right eye: No  discharge.        Left eye: No discharge.     Conjunctiva/sclera: Conjunctivae normal.  Cardiovascular:     Rate and Rhythm: Normal rate and regular rhythm.     Pulses: Normal pulses.  Pulmonary:     Effort: Pulmonary effort is normal. No respiratory distress.     Breath sounds: Normal breath sounds.  Abdominal:     Abdomen is soft. There is no abdominal tenderness. No rebound or guarding. No distention.  No CVA tenderness Musculoskeletal:        General: No swelling. Normal range of motion.     Cervical back: Normal range of motion and neck supple.  Skin:    General: Skin is warm and dry.     Capillary Refill: Capillary refill takes less than 2 seconds.     Findings: No rash.  Neurological:     Mental Status: The patient is awake and alert.      ED Results /  Procedures / Treatments   Labs (all labs ordered are listed, but only abnormal results are displayed) Labs Reviewed  HCG, QUANTITATIVE, PREGNANCY - Abnormal; Notable for the following components:      Result Value   hCG, Beta Chain, Quant, S 2,083 (*)    All other components within normal limits  COMPREHENSIVE METABOLIC PANEL - Abnormal; Notable for the following components:   Glucose, Bld 120 (*)    All other components within normal limits  URINALYSIS, ROUTINE W REFLEX MICROSCOPIC - Abnormal; Notable for the following components:   Color, Urine YELLOW (*)    APPearance HAZY (*)    Hgb urine dipstick LARGE (*)    Protein, ur 100 (*)    Bacteria, UA RARE (*)    All other components within normal limits  CBC WITH DIFFERENTIAL/PLATELET - Abnormal; Notable for the following components:   WBC 11.3 (*)    Neutro Abs 8.0 (*)    All other components within normal limits  POC URINE PREG, ED - Abnormal; Notable for the following components:   Preg Test, Ur Positive (*)    All other components within normal limits  CBC WITH DIFFERENTIAL/PLATELET  ABO/RH     EKG     RADIOLOGY I independently reviewed and  interpreted imaging and agree with radiologists findings.     PROCEDURES:  Critical Care performed:   Procedures   MEDICATIONS ORDERED IN ED: Medications - No data to display   IMPRESSION / MDM / ASSESSMENT AND PLAN / ED COURSE  I reviewed the triage vital signs and the nursing notes.   Differential diagnosis includes, but is not limited to, ectopic pregnancy, spontaneous abortion, inevitable abortion, subchorionic hemorrhage, implantation bleeding.  Patient is awake and alert, mildly hypertensive on arrival, though no tachycardia, afebrile.  Her abdomen is soft and nontender throughout, no active pain or tenderness on exam.  Further workup is indicated.  Labs were obtained which are overall reassuring.  She has a slight leukocytosis to 11.3, though normal H&H.  Her hCG is elevated to 2083.  Ultrasound obtained reveals no definitive IUP.  The pregnancy is very early, I discussed with patient the importance of repeat beta-hCG and ultrasound for identification of the location of the uterus, as well as trending of hCG.  Patient was instructed to call the OB/GYN office for repeat testing in 1 week and she agrees to do so.  In the meantime, we discussed her to ectopic precautions.  Also discussed with her that given her heavy bleeding yesterday, it is possible that she is miscarrying.  Her urine appears dirty with 11-20 squamous epithelial cells.  Patient currently does not have any pain anywhere.  No dysuria.  We discussed return precautions and the importance of close outpatient follow-up.  Patient understands and agrees with plan.  She was discharged in stable condition.   Patient's presentation is most consistent with acute presentation with potential threat to life or bodily function.    FINAL CLINICAL IMPRESSION(S) / ED DIAGNOSES   Final diagnoses:  Vaginal bleeding in pregnancy  Pregnancy of unknown anatomic location     Rx / DC Orders   ED Discharge Orders     None         Note:  This document was prepared using Dragon voice recognition software and may include unintentional dictation errors.   Keturah Shavers 11/09/22 1811    Sharman Cheek, MD 11/09/22 1911

## 2022-11-09 NOTE — ED Triage Notes (Signed)
C/o N/V/D and abdominal cramping that started yesterday and continued to today. Reports heavy vaginal bleeding that started yesterday and today presents like spotting. Seen at [facility] yesterday and had positive pregnancy test  Last menstrual cycle. March 14- March 19th

## 2022-11-09 NOTE — Discharge Instructions (Addendum)
Your serum pregnancy is positive for very early pregnancy.  It is so early that we are unable to see where the pregnancy is on your ultrasound.  Therefore, we are unable to identify where the pregnancy is, therefore it could still be an ectopic pregnancy has been discussed. It is also possible that you are miscarrying, as we discussed. It is very important that you get a repeat blood pregnancy level and a repeat ultrasound in 1 week.  Please call the phone number above to arrange this.  In the meantime, if you develop severe pain, please return to the emergency department.  Please also return for any other new, worsening, or change in symptoms or other concerns.  It was a pleasure caring for you today.

## 2022-11-09 NOTE — ED Triage Notes (Signed)
Pt c/o dizziness,abd cramping & diarrhea x1 day. States she's actively on her period which stated yesterday, is bleeding heavier than usual along w/back pain. Had 1 episode of emesis this AM & 4 times yesterday. Has tried ginger ale & zofran w/relief.

## 2022-11-09 NOTE — ED Notes (Signed)
Lavender top resent to lab at this time.

## 2022-11-14 ENCOUNTER — Encounter: Payer: Self-pay | Admitting: Obstetrics and Gynecology

## 2022-11-14 ENCOUNTER — Other Ambulatory Visit: Payer: Self-pay

## 2022-11-14 ENCOUNTER — Ambulatory Visit: Payer: BC Managed Care – PPO | Admitting: Obstetrics and Gynecology

## 2022-11-14 ENCOUNTER — Ambulatory Visit
Admission: EM | Admit: 2022-11-14 | Discharge: 2022-11-14 | Disposition: A | Discharge status: Home / Self Care | Payer: Medicaid Other | Attending: Emergency Medicine | Admitting: Emergency Medicine

## 2022-11-14 ENCOUNTER — Emergency Department: Payer: Medicaid Other

## 2022-11-14 ENCOUNTER — Emergency Department: Payer: Medicaid Other | Admitting: Certified Registered Nurse Anesthetist

## 2022-11-14 ENCOUNTER — Encounter
Admission: EM | Disposition: A | Discharge status: Home / Self Care | Payer: Self-pay | Source: Home / Self Care | Attending: Emergency Medicine

## 2022-11-14 DIAGNOSIS — Z3A01 Less than 8 weeks gestation of pregnancy: Secondary | ICD-10-CM | POA: Diagnosis not present

## 2022-11-14 DIAGNOSIS — R109 Unspecified abdominal pain: Secondary | ICD-10-CM | POA: Diagnosis not present

## 2022-11-14 DIAGNOSIS — O26891 Other specified pregnancy related conditions, first trimester: Secondary | ICD-10-CM | POA: Diagnosis not present

## 2022-11-14 DIAGNOSIS — D573 Sickle-cell trait: Secondary | ICD-10-CM | POA: Diagnosis not present

## 2022-11-14 DIAGNOSIS — O00102 Left tubal pregnancy without intrauterine pregnancy: Secondary | ICD-10-CM | POA: Diagnosis not present

## 2022-11-14 DIAGNOSIS — O009 Unspecified ectopic pregnancy without intrauterine pregnancy: Secondary | ICD-10-CM | POA: Diagnosis present

## 2022-11-14 HISTORY — PX: DIAGNOSTIC LAPAROSCOPY WITH REMOVAL OF ECTOPIC PREGNANCY: SHX6449

## 2022-11-14 LAB — BASIC METABOLIC PANEL
Anion gap: 8 (ref 5–15)
BUN: 15 mg/dL (ref 6–20)
CO2: 21 mmol/L — ABNORMAL LOW (ref 22–32)
Calcium: 8.9 mg/dL (ref 8.9–10.3)
Chloride: 105 mmol/L (ref 98–111)
Creatinine, Ser: 0.86 mg/dL (ref 0.44–1.00)
GFR, Estimated: >60 mL/min (ref >60)
Glucose, Bld: 103 mg/dL — ABNORMAL HIGH (ref 70–99)
Potassium: 3.6 mmol/L (ref 3.5–5.1)
Sodium: 134 mmol/L — ABNORMAL LOW (ref 135–145)

## 2022-11-14 LAB — CBC
HCT: 39.2 % (ref 36.0–46.0)
Hemoglobin: 12.5 g/dL (ref 12.0–15.0)
MCH: 27.2 pg (ref 26.0–34.0)
MCHC: 31.9 g/dL (ref 30.0–36.0)
MCV: 85.4 fL (ref 80.0–100.0)
Platelets: 287 10*3/uL (ref 150–400)
RBC: 4.59 MIL/uL (ref 3.87–5.11)
RDW: 13.8 % (ref 11.5–15.5)
WBC: 8.1 10*3/uL (ref 4.0–10.5)
nRBC: 0 % (ref 0.0–0.2)

## 2022-11-14 LAB — TYPE AND SCREEN
ABO/RH(D): A POS
Antibody Screen: NEGATIVE

## 2022-11-14 LAB — HCG, QUANTITATIVE, PREGNANCY: hCG, Beta Chain, Quant, S: 6251 m[IU]/mL — ABNORMAL HIGH (ref <5)

## 2022-11-14 LAB — ABO/RH: ABO/RH(D): A POS

## 2022-11-14 SURGERY — LAPAROSCOPY, WITH ECTOPIC PREGNANCY SURGICAL TREATMENT
Anesthesia: General | Laterality: Left

## 2022-11-14 MED ORDER — DROPERIDOL 2.5 MG/ML IJ SOLN: 0.6250 mg | Freq: Once | INTRAMUSCULAR | Status: DC | PRN

## 2022-11-14 MED ORDER — SODIUM CHLORIDE 0.9 % IV BOLUS
1000.0000 mL | Freq: Once | INTRAVENOUS | Status: AC
  Administered 2022-11-14: 1000 mL via INTRAVENOUS

## 2022-11-14 MED ORDER — PHENYLEPHRINE HCL (PRESSORS) 10 MG/ML IV SOLN
INTRAVENOUS | Status: DC | PRN
  Administered 2022-11-14: 160 ug via INTRAVENOUS

## 2022-11-14 MED ORDER — FENTANYL CITRATE (PF) 100 MCG/2ML IJ SOLN
INTRAMUSCULAR | Status: AC
  Filled 2022-11-14: qty 2

## 2022-11-14 MED ORDER — DEXAMETHASONE SODIUM PHOSPHATE 10 MG/ML IJ SOLN
INTRAMUSCULAR | Status: DC | PRN
  Administered 2022-11-14: 10 mg via INTRAVENOUS

## 2022-11-14 MED ORDER — SUCCINYLCHOLINE CHLORIDE 200 MG/10ML IV SOSY
PREFILLED_SYRINGE | INTRAVENOUS | Status: DC | PRN
  Administered 2022-11-14: 100 mg via INTRAVENOUS

## 2022-11-14 MED ORDER — PROPOFOL 1000 MG/100ML IV EMUL
INTRAVENOUS | Status: AC
  Filled 2022-11-14: qty 100

## 2022-11-14 MED ORDER — OXYCODONE-ACETAMINOPHEN 5-325 MG PO TABS: 1.0000 | ORAL_TABLET | ORAL | 0 refills | Status: DC | PRN

## 2022-11-14 MED ORDER — FENTANYL CITRATE (PF) 100 MCG/2ML IJ SOLN: 25.0000 ug | INTRAMUSCULAR | Status: DC | PRN

## 2022-11-14 MED ORDER — ACETAMINOPHEN 500 MG PO TABS
ORAL_TABLET | ORAL | Status: AC
  Filled 2022-11-14: qty 2

## 2022-11-14 MED ORDER — DEXMEDETOMIDINE HCL IN NACL 80 MCG/20ML IV SOLN
INTRAVENOUS | Status: DC | PRN
  Administered 2022-11-14: 12 ug via INTRAVENOUS
  Administered 2022-11-14 (×2): 8 ug via INTRAVENOUS

## 2022-11-14 MED ORDER — CHLORHEXIDINE GLUCONATE 0.12 % MT SOLN
OROMUCOSAL | Status: AC
  Filled 2022-11-14: qty 15

## 2022-11-14 MED ORDER — LACTATED RINGERS IV SOLN: INTRAVENOUS | Status: DC

## 2022-11-14 MED ORDER — LIDOCAINE HCL (CARDIAC) PF 100 MG/5ML IV SOSY
PREFILLED_SYRINGE | INTRAVENOUS | Status: DC | PRN
  Administered 2022-11-14: 100 mg via INTRAVENOUS

## 2022-11-14 MED ORDER — PROPOFOL 10 MG/ML IV BOLUS
INTRAVENOUS | Status: DC | PRN
  Administered 2022-11-14: 170 mg via INTRAVENOUS

## 2022-11-14 MED ORDER — FENTANYL CITRATE (PF) 100 MCG/2ML IJ SOLN
INTRAMUSCULAR | Status: DC | PRN
  Administered 2022-11-14 (×2): 50 ug via INTRAVENOUS

## 2022-11-14 MED ORDER — CELECOXIB 200 MG PO CAPS
400.0000 mg | ORAL_CAPSULE | ORAL | Status: AC
  Administered 2022-11-14: 400 mg via ORAL

## 2022-11-14 MED ORDER — GABAPENTIN 300 MG PO CAPS
ORAL_CAPSULE | ORAL | Status: AC
  Filled 2022-11-14: qty 1

## 2022-11-14 MED ORDER — DOCUSATE SODIUM 100 MG PO CAPS: 100.0000 mg | ORAL_CAPSULE | Freq: Two times a day (BID) | ORAL | 2 refills | Status: DC | PRN

## 2022-11-14 MED ORDER — BUPIVACAINE HCL 0.5 % IJ SOLN
INTRAMUSCULAR | Status: DC | PRN
  Administered 2022-11-14: 20 mL

## 2022-11-14 MED ORDER — FENTANYL CITRATE PF 50 MCG/ML IJ SOSY
50.0000 ug | PREFILLED_SYRINGE | Freq: Once | INTRAMUSCULAR | Status: AC
  Administered 2022-11-14: 50 ug via INTRAVENOUS
  Filled 2022-11-14: qty 1

## 2022-11-14 MED ORDER — ACETAMINOPHEN 500 MG PO TABS
1000.0000 mg | ORAL_TABLET | ORAL | Status: AC
  Administered 2022-11-14: 1000 mg via ORAL

## 2022-11-14 MED ORDER — CELECOXIB 200 MG PO CAPS
ORAL_CAPSULE | ORAL | Status: AC
  Filled 2022-11-14: qty 2

## 2022-11-14 MED ORDER — ONDANSETRON HCL 4 MG/2ML IJ SOLN
INTRAMUSCULAR | Status: DC | PRN
  Administered 2022-11-14: 4 mg via INTRAVENOUS

## 2022-11-14 MED ORDER — METOCLOPRAMIDE HCL 5 MG/ML IJ SOLN
10.0000 mg | Freq: Once | INTRAMUSCULAR | Status: AC
  Administered 2022-11-14: 10 mg via INTRAVENOUS
  Filled 2022-11-14: qty 2

## 2022-11-14 MED ORDER — GABAPENTIN 300 MG PO CAPS
300.0000 mg | ORAL_CAPSULE | ORAL | Status: AC
  Administered 2022-11-14: 300 mg via ORAL

## 2022-11-14 MED ORDER — 0.9 % SODIUM CHLORIDE (POUR BTL) OPTIME
TOPICAL | Status: DC | PRN
  Administered 2022-11-14: 250 mL

## 2022-11-14 MED ORDER — ONDANSETRON HCL 4 MG/2ML IJ SOLN
4.0000 mg | Freq: Once | INTRAMUSCULAR | Status: AC
  Administered 2022-11-14: 4 mg via INTRAVENOUS
  Filled 2022-11-14: qty 2

## 2022-11-14 MED ORDER — MIDAZOLAM HCL 2 MG/2ML IJ SOLN
INTRAMUSCULAR | Status: DC | PRN
  Administered 2022-11-14 (×2): 1 mg via INTRAVENOUS

## 2022-11-14 MED ORDER — ROCURONIUM BROMIDE 100 MG/10ML IV SOLN
INTRAVENOUS | Status: DC | PRN
  Administered 2022-11-14: 30 mg via INTRAVENOUS
  Administered 2022-11-14: 20 mg via INTRAVENOUS

## 2022-11-14 MED ORDER — MIDAZOLAM HCL 2 MG/2ML IJ SOLN
INTRAMUSCULAR | Status: AC
  Filled 2022-11-14: qty 2

## 2022-11-14 MED ORDER — BUPIVACAINE HCL (PF) 0.5 % IJ SOLN
INTRAMUSCULAR | Status: AC
  Filled 2022-11-14: qty 30

## 2022-11-14 MED ORDER — SUGAMMADEX SODIUM 200 MG/2ML IV SOLN
INTRAVENOUS | Status: DC | PRN
  Administered 2022-11-14: 200 mg via INTRAVENOUS

## 2022-11-14 MED ORDER — IBUPROFEN 600 MG PO TABS: 600.0000 mg | ORAL_TABLET | Freq: Four times a day (QID) | ORAL | 0 refills | Status: DC | PRN

## 2022-11-14 SURGICAL SUPPLY — 47 items
ADH SKN CLS APL DERMABOND .7 (GAUZE/BANDAGES/DRESSINGS) ×1
APL PRP STRL LF DISP 70% ISPRP (MISCELLANEOUS) ×1
BLADE SURG SZ11 CARB STEEL (BLADE) ×1 IMPLANT
CATH ROBINSON RED A/P 16FR (CATHETERS) ×1 IMPLANT
CHLORAPREP W/TINT 26 (MISCELLANEOUS) ×1 IMPLANT
CORD MONOPOLAR M/FML 12FT (MISCELLANEOUS) IMPLANT
DERMABOND ADVANCED .7 DNX12 (GAUZE/BANDAGES/DRESSINGS) ×1 IMPLANT
GAUZE 4X4 16PLY ~~LOC~~+RFID DBL (SPONGE) ×2 IMPLANT
GLOVE BIO SURGEON STRL SZ 6.5 (GLOVE) ×2 IMPLANT
GLOVE INDICATOR 7.0 STRL GRN (GLOVE) ×1 IMPLANT
GLOVE PI ORTHO PRO STRL 7.5 (GLOVE) ×1 IMPLANT
GOWN STRL REUS W/ TWL LRG LVL3 (GOWN DISPOSABLE) ×3 IMPLANT
GOWN STRL REUS W/TWL LRG LVL3 (GOWN DISPOSABLE) ×3
GOWN STRL REUS W/TWL XL LVL4 (GOWN DISPOSABLE) ×1 IMPLANT
GRASPER SUT TROCAR 14GX15 (MISCELLANEOUS) IMPLANT
IRRIGATION STRYKERFLOW (MISCELLANEOUS) ×1 IMPLANT
IRRIGATOR STRYKERFLOW (MISCELLANEOUS) ×1
IV LACTATED RINGERS 1000ML (IV SOLUTION) ×1 IMPLANT
KIT PINK PAD W/HEAD ARE REST (MISCELLANEOUS) ×1 IMPLANT
KIT PINK PAD W/HEAD ARM REST (MISCELLANEOUS) ×1 IMPLANT
KIT TURNOVER CYSTO (KITS) ×1 IMPLANT
LIGASURE LAP MARYLAND 5MM 37CM (ELECTROSURGICAL) IMPLANT
MANIFOLD NEPTUNE II (INSTRUMENTS) ×1 IMPLANT
NS IRRIG 500ML POUR BTL (IV SOLUTION) ×1 IMPLANT
PACK GYN LAPAROSCOPIC (MISCELLANEOUS) ×1 IMPLANT
PAD OB MATERNITY 4.3X12.25 (PERSONAL CARE ITEMS) ×1 IMPLANT
PAD PREP OB/GYN DISP 24X41 (PERSONAL CARE ITEMS) ×1 IMPLANT
SCISSORS METZENBAUM CVD 33 (INSTRUMENTS) IMPLANT
SCRUB CHG 4% DYNA-HEX 4OZ (MISCELLANEOUS) ×1 IMPLANT
SET TUBE SMOKE EVAC HIGH FLOW (TUBING) ×1 IMPLANT
SHEARS HARMONIC ACE PLUS 36CM (ENDOMECHANICALS) IMPLANT
SLEEVE Z-THREAD 5X100MM (TROCAR) ×1 IMPLANT
SUT MNCRL 4-0 (SUTURE) ×1
SUT MNCRL 4-0 27XMFL (SUTURE) ×1
SUT VIC AB 3-0 SH 27 (SUTURE)
SUT VIC AB 3-0 SH 27X BRD (SUTURE) IMPLANT
SUT VIC AB 4-0 FS2 27 (SUTURE) ×1 IMPLANT
SUT VICRYL 0 AB UR-6 (SUTURE) IMPLANT
SUT VICRYL 0 UR6 27IN ABS (SUTURE) ×1 IMPLANT
SUTURE MNCRL 4-0 27XMF (SUTURE) IMPLANT
SYS BAG RETRIEVAL 10MM (BASKET) ×1
SYSTEM BAG RETRIEVAL 10MM (BASKET) IMPLANT
TRAP FLUID SMOKE EVACUATOR (MISCELLANEOUS) ×1 IMPLANT
TROCAR XCEL UNIV SLVE 11M 100M (ENDOMECHANICALS) IMPLANT
TROCAR Z-THRD FIOS HNDL 11X100 (TROCAR) ×1 IMPLANT
TROCAR Z-THREAD FIOS 5X100MM (TROCAR) ×1 IMPLANT
WATER STERILE IRR 500ML POUR (IV SOLUTION) ×1 IMPLANT

## 2022-11-14 NOTE — Op Note (Signed)
Procedure(s): DIAGNOSTIC LAPAROSCOPY, LEFT SALPINGECTOMY WITH REMOVAL OF ECTOPIC PREGNANCY Procedure Note  Alexis Elliott female 32 y.o. 11/14/2022  Indications: The patient is a 32 y.o. [id number] female with left ectopic pregnancy, pelvic pain  Pre-operative Diagnosis: Left ectopic pregnancy  Post-operative Diagnosis: Same  Surgeon: Hildred Laser, MD  Assistants:  Surgical Scrub Assist.   Anesthesia: General endotracheal anesthesia  Findings: Uterus was sounded to 10 cm. Small amount of hemoperitoneum estimated to be about 100 cc of blood and clots.  Dilated left fallopian tube containing ectopic gestation. Small appearing uterus with 2 fibroids (submucosal vs intramural) on posterior and anterior surface of the uterus, ~ 3-4 cm each. Right fallopian tube normal, Right ovary with small simple cyst. Left ovary.  Procedure Details: The patient was seen in the Holding Room. The risks, benefits, complications, treatment options, and expected outcomes were discussed with the patient.  The patient concurred with the proposed plan, giving informed consent.  The site of surgery properly noted/marked. The patient was taken to the Operating Room, identified as Alexis Elliott and the procedure verified as Procedure(s) (LRB): DIAGNOSTIC LAPAROSCOPY, LEFT SALPINGECTOMY WITH REMOVAL OF ECTOPIC PREGNANCY (Left). A Time Out was held and the above information confirmed.  The patient was taken to the operating room where general anesthesia was administered and was found to be adequate.  She was placed in the dorsal lithotomy position, and was prepped and draped in a sterile manner.  A straight catheterization was performed.  Next, a speculum was inserted into the vagina and the cervix was grasped with a single-toothed tenaculum and a uterine manipulator was then advanced into the uterus .  The tenaculum was removed from the cervix and the speculum was removed from the vagina.   After an adequate timeout was  performed, attention was turned to the abdomen where an umbilical incision was made with the scalpel.  The Optiview 11-mm trocar and sleeve were then advanced without difficulty with the laparoscope under direct visualization into the abdomen.  The abdomen was then insufflated with carbon dioxide gas and adequate pneumoperitoneum was obtained.  A survey of the patient's pelvis and abdomen revealed the findings above.  A 5-mm  right lower quadrant port and a 5-mm suprapubic port were then placed under direct visualization.  The Nezhat suction irrigator was then used to suction the hemoperitoneum and irrigate the pelvis.  Attention was then turned to the left fallopian tube which was grasped and ligated from the underlying mesosalpinx and uterine attachment using the Ligasure instrument.  The specimen was placed in an EndoCatch bag and removed from the abdomen intact.  The right ovarian cyst was incised and allowed to drain with clear fluid. Good hemostasis was noted. The abdomen was desufflated, and all instruments were removed.  The fascial incision of the 11-mm site was reapproximated with a 0 Vicryl figure-of-eight stitch; and all skin incisions were closed with 4-0 Monocryl and Dermabond. The patient tolerated the procedure well.  Sponge, lap, and needle counts were correct times three.  The patient was then taken to the recovery room awake, extubated and in stable condition.   The patient will be discharged to home as per PACU criteria.  Routine postoperative instructions given.  She was prescribed Percocet, Ibuprofen and Colace.  She will follow up in the office in about 2 weeks for postoperative evaluation.   Estimated Blood Loss:  5 ml surgical blood loss. Approximately 100 ml of peritoneum.       Drains: straight catheterization prior  to procedure with 20 ml of clear urine         Total IV Fluids: 1000 ml  Specimens: Left fallopian tube containing ectopic gestation         Implants: None          Complications:  None; patient tolerated the procedure well.         Disposition: PACU - hemodynamically stable.         Condition: stable   Hildred Laser, MD [facility] at [facility]

## 2022-11-14 NOTE — Consult Note (Signed)
Reason for Consult: Ectopic Pregnancy Referring Physician: Phineas Semen, MD (ER Physician)  HPI:  Alexis Elliott is an 32 y.o. female who presented to the Emergency Room with complaints of abdominal pain.  Patient reports that at 0725 AM this morning  she began noticing some significant abdominal pain. Had Tylenol and was given crackers, but this did not help.  Came to the Emergency Room.  Of note, patient had been seen in the ER ~ 1 week prior due to complaints of vaginal bleeding, after discovery of pregnancy at an Urgent Care.  At that time no IUP was visualized with BHCG quant level of 2083.  Differential diangosis at that time was ectopic vs early pregnancy, and was given ectopic precautions and encouraged to follow up in 1 week in office, notes that visit was supposed to be today.  Current symptoms include nausea, warm flushes, and worsening sharp pain on left side. Currently, has vomited once today.  Last ate at 0800 this morning.   Pertinent Gynecological History: Menses: flow is moderate and regular every month without intermenstrual spotting Contraception: none.  Previously was on Depo Provera for years until last July.  Blood transfusions: none Sexually transmitted diseases: no past history Previous GYN Procedures:  None   Last pap: normal Date: 09/23/2019   OB History  Gravida Para Term Preterm AB Living  5 1 1  0 3 1  SAB IAB Ectopic Multiple Live Births  0 3     1    # Outcome Date GA Lbr Len/2nd Weight Sex Delivery Anes PTL Lv  5 Current           4 IAB 2022          3 IAB 2021          2 IAB 2019          1 Term 10/15/06    F Vag-Spont        Menstrual History: Menarche age: 56 Patient's last menstrual period was 09/28/2022 (exact date).    Past Medical History:  Diagnosis Date   Sickle cell trait (HCC)     Past Surgical History:  Procedure Laterality Date   FINGER SURGERY      Family History  Problem Relation Age of Onset   Hypertension Paternal  Grandfather    Hypertension Paternal Grandmother    Diabetes Paternal Grandmother    Diabetes Maternal Grandmother    Hypertension Maternal Grandmother    Sickle cell trait Father     Social History   Tobacco Use   Smoking status: Never   Smokeless tobacco: Never  Vaping Use   Vaping Use: Never used  Substance Use Topics   Alcohol use: No    Comment: social drinker   Drug use: Yes    Frequency: 2.0 times per week    Types: Marijuana     Allergies: No Known Allergies  Medications: Prior to Admission: None  Review of Systems  Constitutional:  Negative for chills and fever.  Respiratory:  Negative for chest tightness and shortness of breath.   Cardiovascular:  Negative for chest pain.  Gastrointestinal:  Positive for abdominal pain, nausea and vomiting. Negative for abdominal distention, constipation and diarrhea.  Genitourinary:  Positive for vaginal bleeding. Negative for dysuria, flank pain, hematuria, menstrual problem and vaginal discharge.  Musculoskeletal: Negative.   Skin: Negative.   Neurological: Negative.   Psychiatric/Behavioral: Negative.      Blood pressure (!) 144/87, pulse 100, temperature 98 F (36.7 C), resp.  rate 18, last menstrual period 09/28/2022, SpO2 99 %.   Physical Exam Vitals reviewed.  Constitutional:      General: She is in acute distress.     Appearance: She is obese. She is not ill-appearing.     Comments: Mild distress with bouts of pain  HENT:     Head: Normocephalic and atraumatic.     Right Ear: External ear normal.     Left Ear: External ear normal.     Nose: Nose normal. No congestion.     Mouth/Throat:     Mouth: Mucous membranes are moist.     Pharynx: Oropharynx is clear.  Eyes:     Extraocular Movements: Extraocular movements intact.     Pupils: Pupils are equal, round, and reactive to light.  Cardiovascular:     Rate and Rhythm: Normal rate and regular rhythm.     Heart sounds: Normal heart sounds.  Pulmonary:      Effort: Pulmonary effort is normal.     Breath sounds: Normal breath sounds.  Abdominal:     General: Abdomen is flat. Bowel sounds are normal. There is no distension.     Palpations: Abdomen is soft.     Tenderness: There is abdominal tenderness. There is no guarding or rebound.  Genitourinary:    Comments: Deferred to OR Musculoskeletal:        General: Normal range of motion.     Cervical back: Normal range of motion.  Skin:    General: Skin is warm and dry.  Neurological:     General: No focal deficit present.     Mental Status: She is alert and oriented to person, place, and time.  Psychiatric:        Mood and Affect: Mood normal.     Results for orders placed or performed during the hospital encounter of 11/14/22 (from the past 48 hour(s))  CBC     Status: None   Collection Time: 11/14/22  8:38 AM  Result Value Ref Range   WBC 8.1 4.0 - 10.5 K/uL   RBC 4.59 3.87 - 5.11 MIL/uL   Hemoglobin 12.5 12.0 - 15.0 g/dL   HCT 16.1 09.6 - 04.5 %   MCV 85.4 80.0 - 100.0 fL   MCH 27.2 26.0 - 34.0 pg   MCHC 31.9 30.0 - 36.0 g/dL   RDW 40.9 81.1 - 91.4 %   Platelets 287 150 - 400 K/uL   nRBC 0.0 0.0 - 0.2 %    Comment: Performed at [facility], [address], [place], [place] [address]  Basic metabolic panel     Status: Abnormal   Collection Time: 11/14/22  8:38 AM  Result Value Ref Range   Sodium 134 (L) 135 - 145 mmol/L   Potassium 3.6 3.5 - 5.1 mmol/L   Chloride 105 98 - 111 mmol/L   CO2 21 (L) 22 - 32 mmol/L   Glucose, Bld 103 (H) 70 - 99 mg/dL    Comment: Glucose reference range applies only to samples taken after fasting for at least 8 hours.   BUN 15 6 - 20 mg/dL   Creatinine, Ser 6.21 0.44 - 1.00 mg/dL   Calcium 8.9 8.9 - 30.8 mg/dL   GFR, Estimated >65 >78 mL/min    Comment: (NOTE) Calculated using the CKD-EPI Creatinine Equation (2021)    Anion gap 8 5 - 15    Comment: Performed at [facility], [address] [place], [place] [address]   ABO/Rh  Status: None   Collection Time: 11/14/22  8:38 AM  Result Value Ref Range   ABO/RH(D)      A POS Performed at [facility], [address] [place], [place] [address]   hCG, quantitative, pregnancy     Status: Abnormal   Collection Time: 11/14/22  8:38 AM  Result Value Ref Range   hCG, Beta Chain, Quant, S 6,251 (H) <5 mIU/mL    Comment:          GEST. AGE      CONC.  (mIU/mL)   <=1 WEEK        5 - 50     2 WEEKS       50 - 500     3 WEEKS       100 - 10,000     4 WEEKS     1,000 - 30,000     5 WEEKS     3,500 - 115,000   6-8 WEEKS     12,000 - 270,000    12 WEEKS     15,000 - 220,000        FEMALE AND NON-PREGNANT FEMALE:     LESS THAN 5 mIU/mL Performed at [facility], [address] [place], [place] [address]     US OB LESS THAN 14 WEEKS WITH OB TRANSVAGINAL  Result Date: 11/14/2022 CLINICAL DATA:  One day history of vaginal bleeding and cramping EXAM: OBSTETRIC <14 WK [place] AND TRANSVAGINAL OB US TECHNIQUE: Both transabdominal and transvaginal ultrasound examinations were performed for complete evaluation of the gestation as well as the maternal uterus, adnexal regions, and pelvic cul-de-sac. Transvaginal technique was performed to assess early pregnancy. COMPARISON:  Obstetric ultrasound examination dated 11/09/2022 FINDINGS: Intrauterine gestational sac: None Yolk sac:  Visualized. Embryo:  Visualized. Cardiac Activity: Visualized. Heart Rate: 94 bpm MSD: N/A CRL:  2.7 mm   5 w   6 d Subchorionic hemorrhage:  None visualized. Maternal uterus/adnexae: Adjacent to but separate from the left ovary and displaced medially posterior to the uterus, there is a thick-walled structure containing a presumed yolk sac and fetal pole demonstrating cardiac activity. This structure is inseparable from a dilated tubular structure, likely the fallopian tube. Small volume complex fluid within the pelvis. Normal ovaries. Right ovary contains the corpus luteum cyst.  Endometrium measures 9 mm. Heterogeneously hypoechoic mass within the right uterine body measures 2.2 x 2.2 x 1.6 cm, likely leiomyoma. IMPRESSION: Findings suspicious for left adnexal ectopic pregnancy, presumably within the fallopian tube. Associated small volume complex free fluid. Critical Value/emergent results were called by telephone at the time of interpretation on 11/14/2022 at 12:32 pm to provider [facility] , who verbally acknowledged these results. Electronically Signed   By: Agustin Cree M.D.   On: 11/14/2022 12:39    Assessment/Plan:31 y.o. [id number] with left ectopic pregnancy.   On exam, she had stable vital signs, and labs (Hgb 12.5)  . Patient was counseled regarding need for surgical intervention with laparoscopy and removal of ectopic pregnancy. . Risks of surgery including bleeding which may require transfusion or reoperation, infection, injury to bowel or other surrounding organs, need for additional procedures including (laparoscopy or) laparotomy were explained to patient and written informed consent was obtained.  Also informed of likely less successful management option of Methotrexate (due to BHCG and fetal cardiac activity present, medication is a relative contraindication).  Patient ok to proceed with surgery. Patient has been NPO since 0800 and she will remain NPO for  procedure. Anesthesia and OR aware.  Preoperative prophylactic antibiotics and SCDs ordered on call to the OR.  To OR when ready.   Hildred Laser, MD [facility] at [facility] 11/14/2022

## 2022-11-14 NOTE — Anesthesia Procedure Notes (Signed)
Procedure Name: Intubation Date/Time: 11/14/2022 4:13 PM  Performed by: Maryla Morrow., CRNAPre-anesthesia Checklist: Patient identified, Patient being monitored, Timeout performed, Emergency Drugs available and Suction available Patient Re-evaluated:Patient Re-evaluated prior to induction Oxygen Delivery Method: Circle system utilized Preoxygenation: Pre-oxygenation with 100% oxygen Induction Type: IV induction Ventilation: Mask ventilation without difficulty Laryngoscope Size: 3 and McGraph Grade View: Grade I Tube type: Oral Tube size: 7.0 mm Number of attempts: 1 Airway Equipment and Method: Stylet Placement Confirmation: ETT inserted through vocal cords under direct vision, positive ETCO2 and breath sounds checked- equal and bilateral Secured at: 19 cm Tube secured with: Tape Dental Injury: Teeth and Oropharynx as per pre-operative assessment

## 2022-11-14 NOTE — Consult Note (Signed)
Brief Note: Pt seen in ED for spotting and pain over her left lower side. [place] consistent with ectopic pregnancy. Reviewed US findings and labs with Dr Valentino Saxon.  Surgery recommended.   Reviewed US findings with pt, pt tearful, understanding of recommendation. Briefly discussed option for Methotrexate, but at this time surgery is recommended as Methotrexate  is likely to fail.   Pt is a [id number] Denies any significant medical or surgical history.  She last had a sip of water or ginger ale around 8 am this am.   Dr Valentino Saxon notified the pt wishes to proceed with surgery.   Carie Caddy, CNM  [facility]  11/14/2022 2:05 PM

## 2022-11-14 NOTE — ED Triage Notes (Signed)
Pt comes with c/o vaginal bleeding that started today and some cramping. Pt states she is 4weeks preg. Pt states little spotting and pain to left side.

## 2022-11-14 NOTE — Discharge Instructions (Signed)
AMBULATORY SURGERY  ?DISCHARGE INSTRUCTIONS ? ? ?The drugs that you were given will stay in your system until tomorrow so for the next 24 hours you should not: ? ?Drive an automobile ?Make any legal decisions ?Drink any alcoholic beverage ? ? ?You may resume regular meals tomorrow.  Today it is better to start with liquids and gradually work up to solid foods. ? ?You may eat anything you prefer, but it is better to start with liquids, then soup and crackers, and gradually work up to solid foods. ? ? ?Please notify your doctor immediately if you have any unusual bleeding, trouble breathing, redness and pain at the surgery site, drainage, fever, or pain not relieved by medication. ? ? ? ?Additional Instructions: ? ? ? ?Please contact your physician with any problems or Same Day Surgery at [phone], Monday through Friday 6 am to 4 pm, or [place] [facility] [place] Main number at [phone].  ?

## 2022-11-14 NOTE — ED Provider Notes (Signed)
[facility] Provider Note    Event Date/Time   First MD Initiated Contact with Patient 11/14/22 0848     (approximate)   History   Vaginal Bleeding   HPI  Alexis Elliott is a 32 y.o. female who presents to the emergency department today because of concerns for vaginal bleeding in the setting of early pregnancy.  She believes she is roughly around [redacted] weeks pregnant.  Has had some spotting however today started having significant pain and cramping.  Is located primarily in the suprapubic and left lower quadrant.  Patient has had some nausea.  She denies previous ectopic or problems with pregnancies in the past.     Physical Exam   Triage Vital Signs: ED Triage Vitals  Enc Vitals Group     BP 11/14/22 0838 (!) 144/87     Pulse Rate 11/14/22 0838 100     Resp 11/14/22 0838 18     Temp 11/14/22 0838 98 F (36.7 C)     Temp src --      SpO2 11/14/22 0838 99 %     Weight --      Height --      Head Circumference --      Peak Flow --      Pain Score 11/14/22 0837 10     Pain Loc --      Pain Edu? --      Excl. in GC? --     Most recent vital signs: Vitals:   11/14/22 0838  BP: (!) 144/87  Pulse: 100  Resp: 18  Temp: 98 F (36.7 C)  SpO2: 99%   General: Awake, alert, oriented. CV:  Good peripheral perfusion. Regular rate and rhythm. Resp:  Normal effort. Lungs clear. Abd:  No distention. Non tender.   ED Results / Procedures / Treatments   Labs (all labs ordered are listed, but only abnormal results are displayed) Labs Reviewed  BASIC METABOLIC PANEL - Abnormal; Notable for the following components:      Result Value   Sodium 134 (*)    CO2 21 (*)    Glucose, Bld 103 (*)    All other components within normal limits  CBC  HCG, QUANTITATIVE, PREGNANCY  URINALYSIS, ROUTINE W REFLEX MICROSCOPIC  POC URINE PREG, ED  ABO/RH     EKG  None   RADIOLOGY I independently interpreted and visualized the pelvic US. My interpretation:  ectopic Radiology interpretation:  IMPRESSION:  Findings suspicious for left adnexal ectopic pregnancy, presumably  within the fallopian tube. Associated small volume complex free  fluid.      PROCEDURES:  Critical Care performed: No   MEDICATIONS ORDERED IN ED: Medications - No data to display   IMPRESSION / MDM / ASSESSMENT AND PLAN / ED COURSE  I reviewed the triage vital signs and the nursing notes.                              Differential diagnosis includes, but is not limited to, ectopic, miscarriage  Patient's presentation is most consistent with acute presentation with potential threat to life or bodily function.   Patient presented to the emergency department today because of concerns for vaginal bleeding and lower abdominal pain.  On exam she is tender in the suprapubic and left adnexal regions.  Ultrasound is concerning for ectopic pregnancy.  Discussed with OB/GYN.  They will plan on taking the patient to  the OR.     FINAL CLINICAL IMPRESSION(S) / ED DIAGNOSES   Final diagnoses:  Left tubal pregnancy, unspecified whether intrauterine pregnancy present     Note:  This document was prepared using Dragon voice recognition software and may include unintentional dictation errors.    Phineas Semen, MD 11/14/22 [phone]

## 2022-11-14 NOTE — Transfer of Care (Signed)
Immediate Anesthesia Transfer of Care Note  Patient: Alexis Elliott  Procedure(s) Performed: DIAGNOSTIC LAPAROSCOPY, LEFT SALPINGECTOMY WITH REMOVAL OF ECTOPIC PREGNANCY (Left)  Patient Location: PACU  Anesthesia Type:General  Level of Consciousness: drowsy  Airway & Oxygen Therapy: Patient Spontanous Breathing and Patient connected to nasal cannula oxygen  Post-op Assessment: Report given to RN  Post vital signs: stable  Last Vitals:  Vitals Value Taken Time  BP 108/52 11/14/22 1720  Temp    Pulse 95 11/14/22 1720  Resp 25 11/14/22 1720  SpO2 98 % 11/14/22 1720  Vitals shown include unvalidated device data.  Last Pain:  Vitals:   11/14/22 1457  TempSrc: Oral  PainSc: 2          Complications: No notable events documented.

## 2022-11-14 NOTE — ED Notes (Signed)
Report given to Same Day Surgery.

## 2022-11-14 NOTE — Anesthesia Preprocedure Evaluation (Signed)
Anesthesia Evaluation  Patient identified by MRN, date of birth, ID band Patient awake    Reviewed: Allergy & Precautions, H&P , NPO status , Patient's Chart, lab work & pertinent test results, reviewed documented beta blocker date and time   History of Anesthesia Complications Negative for: history of anesthetic complications  Airway Mallampati: I  TM Distance: >3 FB Neck ROM: full    Dental  (+) Dental Advidsory Given, Teeth Intact   Pulmonary neg pulmonary ROS   Pulmonary exam normal breath sounds clear to auscultation       Cardiovascular Exercise Tolerance: Good negative cardio ROS Normal cardiovascular exam Rhythm:regular Rate:Normal     Neuro/Psych negative neurological ROS  negative psych ROS   GI/Hepatic negative GI ROS, Neg liver ROS,,,  Endo/Other  negative endocrine ROS    Renal/GU negative Renal ROS  negative genitourinary   Musculoskeletal   Abdominal   Peds  Hematology  (+) Blood dyscrasia, Sickle cell trait   Anesthesia Other Findings Past Medical History: No date: Sickle cell trait (HCC)  Patient coming for removal of ectopic pregnancy.  Reproductive/Obstetrics negative OB ROS                             Anesthesia Physical Anesthesia Plan  ASA: 2  Anesthesia Plan: General   Post-op Pain Management:    Induction: Intravenous, Rapid sequence and Cricoid pressure planned  PONV Risk Score and Plan: 3 and Ondansetron, Midazolam, Dexamethasone and Treatment may vary due to age or medical condition  Airway Management Planned: Oral ETT  Additional Equipment:   Intra-op Plan:   Post-operative Plan: Extubation in OR  Informed Consent: I have reviewed the patients History and Physical, chart, labs and discussed the procedure including the risks, benefits and alternatives for the proposed anesthesia with the patient or authorized representative who has indicated  his/her understanding and acceptance.     Dental Advisory Given  Plan Discussed with: Anesthesiologist, CRNA and Surgeon  Anesthesia Plan Comments:        Anesthesia Quick Evaluation

## 2022-11-15 ENCOUNTER — Encounter: Payer: Self-pay | Admitting: Obstetrics and Gynecology

## 2022-11-15 DIAGNOSIS — Z419 Encounter for procedure for purposes other than remedying health state, unspecified: Secondary | ICD-10-CM | POA: Diagnosis not present

## 2022-11-16 LAB — SURGICAL PATHOLOGY

## 2022-11-20 ENCOUNTER — Ambulatory Visit: Payer: BC Managed Care – PPO | Admitting: Obstetrics

## 2022-11-21 NOTE — Anesthesia Postprocedure Evaluation (Signed)
Anesthesia Post Note  Patient: Alexis Elliott  Procedure(s) Performed: DIAGNOSTIC LAPAROSCOPY, LEFT SALPINGECTOMY WITH REMOVAL OF ECTOPIC PREGNANCY (Left)  Patient location during evaluation: PACU Anesthesia Type: General Level of consciousness: awake and alert Pain management: pain level controlled Vital Signs Assessment: post-procedure vital signs reviewed and stable Respiratory status: spontaneous breathing, nonlabored ventilation, respiratory function stable and patient connected to nasal cannula oxygen Cardiovascular status: blood pressure returned to baseline and stable Postop Assessment: no apparent nausea or vomiting Anesthetic complications: no   No notable events documented.   Last Vitals:  Vitals:   11/14/22 1805 11/14/22 1813  BP:  (!) 144/85  Pulse: (!) 113 (!) 104  Resp:  17  Temp:  36.6 C  SpO2: 99% 94%    Last Pain:  Vitals:   11/14/22 1813  TempSrc: Temporal  PainSc: 2                  Lenard Simmer

## 2022-11-30 NOTE — Progress Notes (Deleted)
    OBSTETRICS/GYNECOLOGY POST-OPERATIVE CLINIC VISIT  Subjective:     Alexis Elliott is a 32 y.o. female who presents to the clinic {1-10:13787} weeks status post DIAGNOSTIC LAPAROSCOPY, LEFT SALPINGECTOMY WITH REMOVAL OF ECTOPIC PREGNANCY  for  Ectopic . Eating a regular diet {with-without:5700} difficulty. Bowel movements are {normal/abnormal***:19619}. {pain control:13522::"The patient is not having any pain."}  {Common ambulatory SmartLinks:19316}  Review of Systems {ros; complete:30496}   Objective:   LMP 09/28/2022 (Exact Date)  There is no height or weight on file to calculate BMI.  General:  alert and no distress  Abdomen: soft, bowel sounds active, non-tender  Incision:   {incision:13716::"no dehiscence","incision well approximated","healing well","no drainage","no erythema","no hernia","no seroma","no swelling"}    Pathology:    Assessment:   Patient s/p DIAGNOSTIC LAPAROSCOPY, LEFT SALPINGECTOMY WITH REMOVAL OF ECTOPIC PREGNANCY  (surgery)  {doing well:13525::"Doing well postoperatively."}   Plan:   1. Continue any current medications as instructed by provider. 2. Wound care discussed. 3. Operative findings again reviewed. Pathology report discussed. 4. Activity restrictions: {restrictions:13723} 5. Anticipated return to work: {work return:14002}. 6. Follow up: {0-98:11914} {time; units:18646} for ***    Hildred Laser, MD [facility]

## 2022-12-01 ENCOUNTER — Encounter: Payer: BC Managed Care – PPO | Admitting: Obstetrics and Gynecology

## 2022-12-04 NOTE — Progress Notes (Deleted)
    OBSTETRICS/GYNECOLOGY POST-OPERATIVE CLINIC VISIT  Subjective:     Alexis Elliott is a 32 y.o. female who presents to the clinic 3 weeks status post  Diagnostic Laparoscopy with Left Salpingectomy with Removal of Ectopic Pregnancy  for Ectopic . Eating a regular diet {with-without:5700} difficulty. Bowel movements are {normal/abnormal***:19619}. {pain control:13522::"The patient is not having any pain."}  {Common ambulatory SmartLinks:19316}  Review of Systems {ros; complete:30496}   Objective:   LMP 09/28/2022 (Exact Date)  There is no height or weight on file to calculate BMI.  General:  alert and no distress  Abdomen: soft, bowel sounds active, non-tender  Incision:   {incision:13716::"no dehiscence","incision well approximated","healing well","no drainage","no erythema","no hernia","no seroma","no swelling"}    Pathology:    Assessment:   Patient s/p Diagnostic Laparoscopy with Left Salpingectomy with Removal of Ectopic Pregnancy (surgery)  {doing well:13525::"Doing well postoperatively."}   Plan:   1. Continue any current medications as instructed by provider. 2. Wound care discussed. 3. Operative findings again reviewed. Pathology report discussed. 4. Activity restrictions: {restrictions:13723} 5. Anticipated return to work: {work return:14002}. 6. Follow up: {1-61:09604} {time; units:18646} for ***    Hildred Laser, MD [facility]

## 2022-12-05 ENCOUNTER — Encounter: Payer: BC Managed Care – PPO | Admitting: Obstetrics and Gynecology

## 2022-12-16 DIAGNOSIS — Z419 Encounter for procedure for purposes other than remedying health state, unspecified: Secondary | ICD-10-CM | POA: Diagnosis not present

## 2023-01-15 DIAGNOSIS — Z419 Encounter for procedure for purposes other than remedying health state, unspecified: Secondary | ICD-10-CM | POA: Diagnosis not present

## 2023-02-15 DIAGNOSIS — Z419 Encounter for procedure for purposes other than remedying health state, unspecified: Secondary | ICD-10-CM | POA: Diagnosis not present

## 2023-03-11 DIAGNOSIS — Z20822 Contact with and (suspected) exposure to covid-19: Secondary | ICD-10-CM | POA: Diagnosis not present

## 2023-03-11 DIAGNOSIS — J069 Acute upper respiratory infection, unspecified: Secondary | ICD-10-CM | POA: Diagnosis not present

## 2023-03-18 DIAGNOSIS — Z419 Encounter for procedure for purposes other than remedying health state, unspecified: Secondary | ICD-10-CM | POA: Diagnosis not present

## 2023-03-27 ENCOUNTER — Ambulatory Visit
Admission: EM | Admit: 2023-03-27 | Discharge: 2023-03-27 | Disposition: A | Discharge status: Home / Self Care | Payer: Medicaid Other | Attending: Urgent Care | Admitting: Urgent Care

## 2023-03-27 DIAGNOSIS — B9689 Other specified bacterial agents as the cause of diseases classified elsewhere: Secondary | ICD-10-CM | POA: Diagnosis not present

## 2023-03-27 DIAGNOSIS — J019 Acute sinusitis, unspecified: Secondary | ICD-10-CM | POA: Diagnosis not present

## 2023-03-27 MED ORDER — PREDNISONE 20 MG PO TABS
ORAL_TABLET | ORAL | 0 refills | Status: AC
Start: 2023-03-27 — End: 2023-04-01

## 2023-03-27 MED ORDER — AMOXICILLIN-POT CLAVULANATE 875-125 MG PO TABS
1.0000 | ORAL_TABLET | Freq: Two times a day (BID) | ORAL | 0 refills | Status: DC
Start: 2023-03-27 — End: 2023-06-13

## 2023-03-27 NOTE — ED Triage Notes (Signed)
Pt c/o cough & facial pressure x1 wk. Has tried mucinex,theraflu & tylenol w/o relief.

## 2023-03-27 NOTE — Discharge Instructions (Signed)
Follow up here or with your primary care provider if your symptoms are worsening or not improving with treatment.     

## 2023-03-27 NOTE — ED Provider Notes (Addendum)
[facility]    CSN: [id number] Arrival date & time: 03/27/23  1349      History   Chief Complaint Chief Complaint  Patient presents with   Cough    HPI Alexis Elliott is a 32 y.o. female.    Cough  Presents to urgent care with complaint of cough and facial pressure x 2 week.  Patient states she has used over-the-counter medication without any relief.  She states she presented to urgent care about 2 weeks ago and was treated with only cough medication for presumed viral respiratory infection.  Past Medical History:  Diagnosis Date   Sickle cell trait [facility])     Patient Active Problem List   Diagnosis Date Noted   Surveillance for Depo-Provera contraception 01/23/2019    Past Surgical History:  Procedure Laterality Date   DIAGNOSTIC LAPAROSCOPY WITH REMOVAL OF ECTOPIC PREGNANCY Left 11/14/2022   Procedure: DIAGNOSTIC LAPAROSCOPY, LEFT SALPINGECTOMY WITH REMOVAL OF ECTOPIC PREGNANCY;  Surgeon: Hildred Laser, MD;  Location: [facility] ORS;  Service: Gynecology;  Laterality: Left;   FINGER SURGERY      OB History     Gravida  5   Para  1   Term  1   Preterm  0   AB  3   Living  1      SAB  0   IAB  3   Ectopic      Multiple      Live Births  1            Home Medications    Prior to Admission medications   Medication Sig Start Date End Date Taking? Authorizing Provider  oxyCODONE-acetaminophen (PERCOCET) 5-325 MG tablet Take 1-2 tablets by mouth every 4 (four) hours as needed for severe pain. 11/14/22 11/14/23 Yes Hildred Laser, MD  docusate sodium (COLACE) 100 MG capsule Take 1 capsule (100 mg total) by mouth 2 (two) times daily as needed. 11/14/22   Hildred Laser, MD  ibuprofen (ADVIL) 600 MG tablet Take 1 tablet (600 mg total) by mouth every 6 (six) hours as needed. 11/14/22   Hildred Laser, MD    Family History Family History  Problem Relation Age of Onset   Hypertension Paternal Grandfather    Hypertension Paternal Grandmother     Diabetes Paternal Grandmother    Diabetes Maternal Grandmother    Hypertension Maternal Grandmother    Sickle cell trait Father     Social History Social History   Tobacco Use   Smoking status: Never   Smokeless tobacco: Never  Vaping Use   Vaping status: Never Used  Substance Use Topics   Alcohol use: No    Comment: social drinker   Drug use: Yes    Frequency: 2.0 times per week    Types: Marijuana     Allergies   Patient has no known allergies.   Review of Systems Review of Systems  Respiratory:  Positive for cough.      Physical Exam Triage Vital Signs ED Triage Vitals  Encounter Vitals Group     BP 03/27/23 1352 127/86     Systolic BP Percentile --      Diastolic BP Percentile --      Pulse Rate 03/27/23 1352 85     Resp 03/27/23 1352 16     Temp 03/27/23 1352 99 F (37.2 C)     Temp Source 03/27/23 1352 Oral     SpO2 03/27/23 1352 96 %     Weight 03/27/23  1352 174 lb (78.9 kg)     Height 03/27/23 1352 5\' 2"  (1.575 m)     Head Circumference --      Peak Flow --      Pain Score 03/27/23 1355 0     Pain Loc --      Pain Education --      Exclude from Growth Chart --    No data found.  Updated Vital Signs BP 127/86 (BP Location: Left Arm)   Pulse 85   Temp 99 F (37.2 C) (Oral)   Resp 16   Ht 5\' 2"  (1.575 m)   Wt 174 lb (78.9 kg)   LMP 03/16/2023   SpO2 96%   Breastfeeding No   BMI 31.83 kg/m   Visual Acuity Right Eye Distance:   Left Eye Distance:   Bilateral Distance:    Right Eye Near:   Left Eye Near:    Bilateral Near:     Physical Exam Vitals reviewed.  Constitutional:      Appearance: She is ill-appearing.  HENT:     Nose: Congestion present.     Right Sinus: Maxillary sinus tenderness present.     Left Sinus: Maxillary sinus tenderness present.     Mouth/Throat:     Pharynx: Posterior oropharyngeal erythema present. No oropharyngeal exudate.  Cardiovascular:     Rate and Rhythm: Normal rate and regular rhythm.      Pulses: Normal pulses.     Heart sounds: Normal heart sounds.  Pulmonary:     Effort: Pulmonary effort is normal.     Breath sounds: Normal breath sounds.  Skin:    General: Skin is warm and dry.  Neurological:     General: No focal deficit present.     Mental Status: She is alert and oriented to person, place, and time.  Psychiatric:        Mood and Affect: Mood normal.      UC Treatments / Results  Labs (all labs ordered are listed, but only abnormal results are displayed) Labs Reviewed - No data to display  EKG   Radiology No results found.  Procedures Procedures (including critical care time)  Medications Ordered in UC Medications - No data to display  Initial Impression / Assessment and Plan / UC Course  I have reviewed the triage vital signs and the nursing notes.  Pertinent labs & imaging results that were available during my care of the patient were reviewed by me and considered in my medical decision making (see chart for details).   Alexis Elliott is a 32 y.o. female presenting with sinusitis. Patient is afebrile without recent antipyretics, satting well on room air. Overall is ill appearing though non-toxic, well hydrated, without respiratory distress. Pulmonary exam is unremarkable.  Lungs CTAB without wheezing, rhonchi, rales. RRR without murmurs, rubs, gallops.  TMs are WNL bilaterally.  Pharyngeal erythema is present.  No peritonsillar exudate.  Maxillary sinus tenderness with palpation.  Reviewed relevant chart history.   Given duration of symptoms and clinical exam, concern for bacterial sinusitis secondary to past viral sinusitis.  Will prescribe Augmentin as well as a course of prednisone to relieve inflammation and acute symptoms.  Otherwise, continuing to recommend over-the-counter medication for symptom control.  Counseled patient on potential for adverse effects with medications prescribed/recommended today, ER and return-to-clinic precautions  discussed, patient verbalized understanding and agreement with care plan.  Final Clinical Impressions(s) / UC Diagnoses   Final diagnoses:  None   Discharge Instructions  None    ED Prescriptions   None    PDMP not reviewed this encounter.   Charma Igo, FNP 03/27/23 1423    ImmordinoJeannett Senior, FNP 03/27/23 1451

## 2023-03-29 ENCOUNTER — Ambulatory Visit: Payer: Medicaid Other | Admitting: Nurse Practitioner

## 2023-04-17 DIAGNOSIS — Z419 Encounter for procedure for purposes other than remedying health state, unspecified: Secondary | ICD-10-CM | POA: Diagnosis not present

## 2023-05-18 DIAGNOSIS — Z419 Encounter for procedure for purposes other than remedying health state, unspecified: Secondary | ICD-10-CM | POA: Diagnosis not present

## 2023-05-21 ENCOUNTER — Ambulatory Visit: Payer: Medicaid Other | Admitting: Nurse Practitioner

## 2023-06-02 ENCOUNTER — Other Ambulatory Visit: Payer: Self-pay

## 2023-06-02 ENCOUNTER — Emergency Department: Payer: Medicaid Other

## 2023-06-02 ENCOUNTER — Emergency Department
Admission: EM | Admit: 2023-06-02 | Discharge: 2023-06-02 | Disposition: A | Discharge status: Home / Self Care | Payer: Medicaid Other | Attending: Student in an Organized Health Care Education/Training Program | Admitting: Student in an Organized Health Care Education/Training Program

## 2023-06-02 DIAGNOSIS — O209 Hemorrhage in early pregnancy, unspecified: Secondary | ICD-10-CM | POA: Insufficient documentation

## 2023-06-02 DIAGNOSIS — D259 Leiomyoma of uterus, unspecified: Secondary | ICD-10-CM | POA: Diagnosis not present

## 2023-06-02 DIAGNOSIS — O3481 Maternal care for other abnormalities of pelvic organs, first trimester: Secondary | ICD-10-CM | POA: Diagnosis not present

## 2023-06-02 DIAGNOSIS — O4690 Antepartum hemorrhage, unspecified, unspecified trimester: Secondary | ICD-10-CM

## 2023-06-02 DIAGNOSIS — R110 Nausea: Secondary | ICD-10-CM | POA: Insufficient documentation

## 2023-06-02 DIAGNOSIS — Z3A01 Less than 8 weeks gestation of pregnancy: Secondary | ICD-10-CM | POA: Insufficient documentation

## 2023-06-02 HISTORY — DX: Complete or unspecified spontaneous abortion without complication: O03.9

## 2023-06-02 LAB — URINALYSIS, ROUTINE W REFLEX MICROSCOPIC
Bilirubin Urine: NEGATIVE
Glucose, UA: NEGATIVE mg/dL
Hgb urine dipstick: NEGATIVE
Ketones, ur: NEGATIVE mg/dL
Leukocytes,Ua: NEGATIVE
Nitrite: NEGATIVE
Protein, ur: NEGATIVE mg/dL
Specific Gravity, Urine: 1.018 (ref 1.005–1.030)
pH: 5 (ref 5.0–8.0)

## 2023-06-02 LAB — CBC
HCT: 40.7 % (ref 36.0–46.0)
Hemoglobin: 12.8 g/dL (ref 12.0–15.0)
MCH: 27.1 pg (ref 26.0–34.0)
MCHC: 31.4 g/dL (ref 30.0–36.0)
MCV: 86 fL (ref 80.0–100.0)
Platelets: 319 10*3/uL (ref 150–400)
RBC: 4.73 MIL/uL (ref 3.87–5.11)
RDW: 14.9 % (ref 11.5–15.5)
WBC: 13.4 10*3/uL — ABNORMAL HIGH (ref 4.0–10.5)
nRBC: 0 % (ref 0.0–0.2)

## 2023-06-02 LAB — HCG, QUANTITATIVE, PREGNANCY: hCG, Beta Chain, Quant, S: 85973 m[IU]/mL — ABNORMAL HIGH (ref <5)

## 2023-06-02 LAB — POC URINE PREG, ED: Preg Test, Ur: NEGATIVE

## 2023-06-02 MED ORDER — ACETAMINOPHEN 325 MG PO TABS
650.0000 mg | ORAL_TABLET | Freq: Once | ORAL | Status: AC
  Administered 2023-06-02: 650 mg via ORAL
  Filled 2023-06-02: qty 2

## 2023-06-02 NOTE — ED Notes (Signed)
Patient states that last time she went to the bathroom here in the ED, she didn't notice any blood on the tissue.

## 2023-06-02 NOTE — ED Notes (Signed)
G5,P1,AB 3. Patient states she has had two abortions and one ectopic pregnancy on the right side.

## 2023-06-02 NOTE — ED Triage Notes (Signed)
Pt c/o of pelvic cramping and light spotting w/ wiping that started today. Pt had postitive pregnancy test- first prenatal appointment on 11/28. Pt denies V/D, dizziness, CP. Pt reports intermittent nausea.

## 2023-06-02 NOTE — Discharge Instructions (Signed)
Follow-up with your OB for your regular appointments.  You can take Tylenol as needed for pain.

## 2023-06-02 NOTE — ED Provider Notes (Signed)
[facility] Provider Note    Event Date/Time   First MD Initiated Contact with Patient 06/02/23 1819     (approximate)   History   Vaginal Bleeding (pregnant)   HPI  Alexis Elliott is a 32 y.o. female with PMH of sickle cell trait, miscarriage and ectopic pregnancy presents for evaluation of vaginal bleeding and cramping with a positive pregnancy test.  Patient also has had some intermittent nausea.  She noticed some light spotting when wiping this morning, she has not had any large clots.      Physical Exam   Triage Vital Signs: ED Triage Vitals  Encounter Vitals Group     BP 06/02/23 1624 133/89     Systolic BP Percentile --      Diastolic BP Percentile --      Pulse Rate 06/02/23 1624 85     Resp 06/02/23 1624 17     Temp 06/02/23 1624 98.3 F (36.8 C)     Temp Source 06/02/23 1624 Oral     SpO2 06/02/23 1624 99 %     Weight --      Height --      Head Circumference --      Peak Flow --      Pain Score 06/02/23 1625 8     Pain Loc --      Pain Education --      Exclude from Growth Chart --     Most recent vital signs: Vitals:   06/02/23 1624  BP: 133/89  Pulse: 85  Resp: 17  Temp: 98.3 F (36.8 C)  SpO2: 99%    General: Awake, no distress.  CV:  Good peripheral perfusion.  RRR. Resp:  Normal effort.  CTAB. Abd:  No distention.  Suprapubic tenderness to palpation, soft. Other:     ED Results / Procedures / Treatments   Labs (all labs ordered are listed, but only abnormal results are displayed) Labs Reviewed  CBC - Abnormal; Notable for the following components:      Result Value   WBC 13.4 (*)    All other components within normal limits  HCG, QUANTITATIVE, PREGNANCY - Abnormal; Notable for the following components:   hCG, Beta Chain, Quant, S 85,973 (*)    All other components within normal limits  URINALYSIS, ROUTINE W REFLEX MICROSCOPIC - Abnormal; Notable for the following components:   Color, Urine YELLOW (*)     APPearance HAZY (*)    All other components within normal limits  POC URINE PREG, ED    RADIOLOGY  OB ultrasound obtained, I interpreted the images as well as reviewed the radiologist report which showed an intrauterine pregnancy measuring 6 weeks and 5 days with heart rate at 133 bpm.  There is no evidence of subchorionic hemorrhage.  No adnexal mass or free fluid.  There is a 2.3 cm transmural ventral lower uterine segment fibroid to the right.   PROCEDURES:  Critical Care performed: No  Procedures   MEDICATIONS ORDERED IN ED: Medications  acetaminophen (TYLENOL) tablet 650 mg (650 mg Oral Given 06/02/23 2029)     IMPRESSION / MDM / ASSESSMENT AND PLAN / ED COURSE  I reviewed the triage vital signs and the nursing notes.                             32 year old female presents for evaluation of vaginal bleeding and pelvic cramping in the context  of a positive pregnancy test.  Vital signs are stable and patient is NAD on exam.  Differential diagnosis includes, but is not limited to, vaginal bleeding in pregnancy, ectopic pregnancy, threatened miscarriage, subchorionic hemorrhage.  Patient's presentation is most consistent with acute complicated illness / injury requiring diagnostic workup.  Urinalysis unremarkable.  CBC unremarkable aside from mildly elevated WBCs.  Beta-hCG is 85,973.  OB ultrasound shows an IUP measuring 6 weeks and 5 days.  Patient states that she was sexually active last night and I believe this is more likely the cause of her spotting.  I advised her to follow-up with her OB for her regular appointments.  She was given return precautions.  She can take Tylenol as needed for pain.  She voiced understanding, all questions were answered and she was stable at discharge.      FINAL CLINICAL IMPRESSION(S) / ED DIAGNOSES   Final diagnoses:  Vaginal bleeding in pregnancy     Rx / DC Orders   ED Discharge Orders     None        Note:  This  document was prepared using Dragon voice recognition software and may include unintentional dictation errors.   Cameron Ali, PA-C 06/02/23 2029    Willy Eddy, MD 06/03/23 1057

## 2023-06-13 ENCOUNTER — Telehealth: Payer: Medicaid Other

## 2023-06-13 DIAGNOSIS — Z3480 Encounter for supervision of other normal pregnancy, unspecified trimester: Secondary | ICD-10-CM | POA: Insufficient documentation

## 2023-06-13 DIAGNOSIS — Z3689 Encounter for other specified antenatal screening: Secondary | ICD-10-CM | POA: Diagnosis not present

## 2023-06-13 DIAGNOSIS — O0990 Supervision of high risk pregnancy, unspecified, unspecified trimester: Secondary | ICD-10-CM | POA: Insufficient documentation

## 2023-06-13 NOTE — Progress Notes (Addendum)
New OB Intake  I connected with  Alexis Elliott on 06/13/23 at 11:15 AM EST by Video Visit and verified that I am speaking with the correct person using two identifiers. Nurse is located at [facility] and pt is located at home.  I explained I am completing New OB Intake today. We discussed her EDD of 01/19/2024 that is based on LMP of 04/14/2023. Pt is G6/P1041. I reviewed her allergies, medications, Medical/Surgical/OB history, and appropriate screenings. There are no cats in the home.  Based on history, this is a/an pregnancy uncomplicated . Her obstetrical history is significant for  multiple IOB .  Patient Active Problem List   Diagnosis Date Noted   Supervision of other normal pregnancy, antepartum 06/13/2023    Concerns addressed today Nausea; adv per new protocol.  Delivery Plans:  Plans to deliver at [facility].  Anatomy US Explained first scheduled [place] was done Nov 16th - fibroid noted;  and an anatomy scan will be done at 20 weeks.  Labs Discussed genetic screening with patient. Patient desires genetic testing to be drawn at new OB visit. Discussed possible labs to be drawn at new OB appointment.  COVID Vaccine Patient has had COVID vaccine.   Social Determinants of Health Food Insecurity: denies food insecurity Transportation: Patient denies transportation needs.  First visit review I reviewed new OB appt with pt. I explained she will have ob bloodwork and pap smear/pelvic exam if indicated. Explained pt will be seen by an AOB Provider at first visit; encounter routed to appropriate provider.   Loran Senters, [facility] 06/13/2023  11:44 AM

## 2023-06-13 NOTE — Progress Notes (Signed)
Patient is scheduled 07/04/2023 at 9:55 with [facility]

## 2023-06-13 NOTE — Patient Instructions (Signed)
Second Trimester of Pregnancy  The second trimester of pregnancy is from week 13 through week 27. This is months 4 through 6 of pregnancy. The second trimester is often a time when you feel your best. Your body has adjusted to being pregnant, and you begin to feel better physically. During the second trimester: Morning sickness has lessened or stopped completely. You may have more energy. You may have an increase in appetite. The second trimester is also a time when the unborn baby (fetus) is growing rapidly. At the end of the sixth month, the fetus may be up to 12 inches long and weigh about 1 pounds. You will likely begin to feel the baby move (quickening) between 16 and 20 weeks of pregnancy. Body changes during your second trimester Your body continues to go through many changes during your second trimester. The changes vary and generally return to normal after the baby is born. Physical changes Your weight will continue to increase. You will notice your lower abdomen bulging out. You may begin to get stretch marks on your hips, abdomen, and breasts. Your breasts will continue to grow and to become tender. Dark spots or blotches (chloasma or mask of pregnancy) may develop on your face. A dark line from your belly button to the pubic area (linea nigra) may appear. You may have changes in your hair. These can include thickening of your hair, rapid growth, and changes in texture. Some people also have hair loss during or after pregnancy, or hair that feels dry or thin. Health changes You may develop headaches. You may have heartburn. You may develop constipation. You may develop hemorrhoids or swollen, bulging veins (varicose veins). Your gums may bleed and may be sensitive to brushing and flossing. You may urinate more often because the fetus is pressing on your bladder. You may have back pain. This is caused by: Weight gain. Pregnancy hormones that are relaxing the joints in your  pelvis. A shift in weight and the muscles that support your balance. Follow these instructions at home: Medicines Follow your health care provider's instructions regarding medicine use. Specific medicines may be either safe or unsafe to take during pregnancy. Do not take any medicines unless approved by your health care provider. Take a prenatal vitamin that contains at least 600 micrograms (mcg) of folic acid. Eating and drinking Eat a healthy diet that includes fresh fruits and vegetables, whole grains, good sources of protein such as meat, eggs, or tofu, and low-fat dairy products. Avoid raw meat and unpasteurized juice, milk, and cheese. These carry germs that can harm you and your baby. You may need to take these actions to prevent or treat constipation: Drink enough fluid to keep your urine pale yellow. Eat foods that are high in fiber, such as beans, whole grains, and fresh fruits and vegetables. Limit foods that are high in fat and processed sugars, such as fried or sweet foods. Activity Exercise only as directed by your health care provider. Most people can continue their usual exercise routine during pregnancy. Try to exercise for 30 minutes at least 5 days a week. Stop exercising if you develop contractions in your uterus. Stop exercising if you develop pain or cramping in the lower abdomen or lower back. Avoid exercising if it is very hot or humid or if you are at a high altitude. Avoid heavy lifting. If you choose to, you may have sex unless your health care provider tells you not to. Relieving pain and discomfort Wear a supportive  bra to prevent discomfort from breast tenderness. Take warm sitz baths to soothe any pain or discomfort caused by hemorrhoids. Use hemorrhoid cream if your health care provider approves. Rest with your legs raised (elevated) if you have leg cramps or low back pain. If you develop varicose veins: Wear support hose as told by your health care  provider. Elevate your feet for 15 minutes, 3-4 times a day. Limit salt in your diet. Safety Wear your seat belt at all times when driving or riding in a car. Talk with your health care provider if someone is verbally or physically abusive to you. Lifestyle Do not use hot tubs, steam rooms, or saunas. Do not douche. Do not use tampons or scented sanitary pads. Avoid cat litter boxes and soil used by cats. These carry germs that can cause birth defects in the baby and possibly loss of the fetus by miscarriage or stillbirth. Do not use herbal remedies, alcohol, illegal drugs, or medicines that are not approved by your health care provider. Chemicals in these products can harm your baby. Do not use any products that contain nicotine or tobacco, such as cigarettes, e-cigarettes, and chewing tobacco. If you need help quitting, ask your health care provider. General instructions During a routine prenatal visit, your health care provider will do a physical exam and other tests. He or she will also discuss your overall health. Keep all follow-up visits. This is important. Ask your health care provider for a referral to a local prenatal education class. Ask for help if you have counseling or nutritional needs during pregnancy. Your health care provider can offer advice or refer you to specialists for help with various needs. Where to find more information American Pregnancy Association: americanpregnancy.org [employer]: https://www.todd-brady.net/ Office on [employer] Health: MightyReward.co.nz Contact a health care provider if you have: A headache that does not go away when you take medicine. Vision changes or you see spots in front of your eyes. Mild pelvic cramps, pelvic pressure, or nagging pain in the abdominal area. Persistent nausea, vomiting, or diarrhea. A bad-smelling vaginal discharge or foul-smelling urine. Pain when you  urinate. Sudden or extreme swelling of your face, hands, ankles, feet, or legs. A fever. Get help right away if you: Have fluid leaking from your vagina. Have spotting or bleeding from your vagina. Have severe abdominal cramping or pain. Have difficulty breathing. Have chest pain. Have fainting spells. Have not felt your baby move for the time period told by your health care provider. Have new or increased pain, swelling, or redness in an arm or leg. Summary The second trimester of pregnancy is from week 13 through week 27 (months 4 through 6). Do not use herbal remedies, alcohol, illegal drugs, or medicines that are not approved by your health care provider. Chemicals in these products can harm your baby. Exercise only as directed by your health care provider. Most people can continue their usual exercise routine during pregnancy. Keep all follow-up visits. This is important. This information is not intended to replace advice given to you by your health care provider. Make sure you discuss any questions you have with your health care provider. Document Revised: 12/10/2019 Document Reviewed: 10/16/2019 Elsevier Patient Education  2024 [employer]. First Trimester of Pregnancy  The first trimester of pregnancy starts on the first day of your last menstrual period until the end of week 12. This is also called months 1 through 3 of pregnancy. Body changes during your first trimester Your  body goes through many changes during pregnancy. The changes usually return to normal after your baby is born. Physical changes You may gain or lose weight. Your breasts may grow larger and hurt. The area around your nipples may get darker. Dark spots or blotches may develop on your face. You may have changes in your hair. Health changes You may feel like you might vomit (nauseous), and you may vomit. You may have heartburn. You may have headaches. You may have trouble pooping (constipation). Your  gums may bleed. Other changes You may get tired easily. You may pee (urinate) more often. Your menstrual periods will stop. You may not feel hungry. You may want to eat certain kinds of food. You may have changes in your emotions from day to day. You may have more dreams. Follow these instructions at home: Medicines Take over-the-counter and prescription medicines only as told by your doctor. Some medicines are not safe during pregnancy. Take a prenatal vitamin that contains at least 600 micrograms (mcg) of folic acid. Eating and drinking Eat healthy meals that include: Fresh fruits and vegetables. Whole grains. Good sources of protein, such as meat, eggs, or tofu. Low-fat dairy products. Avoid raw meat and unpasteurized juice, milk, and cheese. If you feel like you may vomit, or you vomit: Eat 4 or 5 small meals a day instead of 3 large meals. Try eating a few soda crackers. Drink liquids between meals instead of during meals. You may need to take these actions to prevent or treat trouble pooping: Drink enough fluids to keep your pee (urine) pale yellow. Eat foods that are high in fiber. These include beans, whole grains, and fresh fruits and vegetables. Limit foods that are high in fat and sugar. These include fried or sweet foods. Activity Exercise only as told by your doctor. Most people can do their usual exercise routine during pregnancy. Stop exercising if you have cramps or pain in your lower belly (abdomen) or low back. Do not exercise if it is too hot or too humid, or if you are in a place of great height (high altitude). Avoid heavy lifting. If you choose to, you may have sex unless your doctor tells you not to. Relieving pain and discomfort Wear a good support bra if your breasts are sore. Rest with your legs raised (elevated) if you have leg cramps or low back pain. If you have bulging veins (varicose veins) in your legs: Wear support hose as told by your  doctor. Raise your feet for 15 minutes, 3-4 times a day. Limit salt in your food. Safety Wear your seat belt at all times when you are in a car. Talk with your doctor if someone is hurting you or yelling at you. Talk with your doctor if you are feeling sad or have thoughts of hurting yourself. Lifestyle Do not use hot tubs, steam rooms, or saunas. Do not douche. Do not use tampons or scented sanitary pads. Do not use herbal medicines, illegal drugs, or medicines that are not approved by your doctor. Do not drink alcohol. Do not smoke or use any products that contain nicotine or tobacco. If you need help quitting, ask your doctor. Avoid cat litter boxes and soil that is used by cats. These carry germs that can cause harm to the baby and can cause a loss of your baby by miscarriage or stillbirth. General instructions Keep all follow-up visits. This is important. Ask for help if you need counseling or if you need help with  nutrition. Your doctor can give you advice or tell you where to go for help. Visit your dentist. At home, brush your teeth with a soft toothbrush. Floss gently. Write down your questions. Take them to your prenatal visits. Where to find more information American Pregnancy Association: americanpregnancy.org [employer]: www.acog.org Office on Women's Health: MightyReward.co.nz Contact a doctor if: You are dizzy. You have a fever. You have mild cramps or pressure in your lower belly. You have a nagging pain in your belly area. You continue to feel like you may vomit, you vomit, or you have watery poop (diarrhea) for 24 hours or longer. You have a bad-smelling fluid coming from your vagina. You have pain when you pee. You are exposed to a disease that spreads from person to person, such as chickenpox, measles, Zika virus, HIV, or hepatitis. Get help right away if: You have spotting or bleeding from your vagina. You have  very bad belly cramping or pain. You have shortness of breath or chest pain. You have any kind of injury, such as from a fall or a car crash. You have new or increased pain, swelling, or redness in an arm or leg. Summary The first trimester of pregnancy starts on the first day of your last menstrual period until the end of week 12 (months 1 through 3). Eat 4 or 5 small meals a day instead of 3 large meals. Do not smoke or use any products that contain nicotine or tobacco. If you need help quitting, ask your doctor. Keep all follow-up visits. This information is not intended to replace advice given to you by your health care provider. Make sure you discuss any questions you have with your health care provider. Document Revised: 12/10/2019 Document Reviewed: 10/16/2019 Elsevier Patient Education  2024 [employer] Commonly Asked Questions During Pregnancy  Cats: A parasite can be excreted in cat feces.  To avoid exposure you need to have another person empty the little box.  If you must empty the litter box you will need to wear gloves.  Wash your hands after handling your cat.  This parasite can also be found in raw or undercooked meat so this should also be avoided.  Colds, Sore Throats, Flu: Please check your medication sheet to see what you can take for symptoms.  If your symptoms are unrelieved by these medications please call the office.  Dental Work: Most any dental work Agricultural consultant recommends is permitted.  X-rays should only be taken during the first trimester if absolutely necessary.  Your abdomen should be shielded with a lead apron during all x-rays.  Please notify your provider prior to receiving any x-rays.  Novocaine is fine; gas is not recommended.  If your dentist requires a note from [place] prior to dental work please call the office and we will provide one for you.  Exercise: Exercise is an important part of staying healthy during your pregnancy.  You may continue most exercises  you were accustomed to prior to pregnancy.  Later in your pregnancy you will most likely notice you have difficulty with activities requiring balance like riding a bicycle.  It is important that you listen to your body and avoid activities that put you at a higher risk of falling.  Adequate rest and staying well hydrated are a must!  If you have questions about the safety of specific activities ask your provider.    Exposure to Children with illness: Try to avoid obvious exposure; report any  symptoms to [place] when noted,  If you have chicken pos, red measles or mumps, you should be immune to these diseases.   Please do not take any vaccines while pregnant unless you have checked with your OB provider.  Fetal Movement: After 28 weeks we recommend you do "kick counts" twice daily.  Lie or sit down in a calm quiet environment and count your baby movements "kicks".  You should feel your baby at least 10 times per hour.  If you have not felt 10 kicks within the first hour get up, walk around and have something sweet to eat or drink then repeat for an additional hour.  If count remains less than 10 per hour notify your provider.  Fumigating: Follow your pest control agent's advice as to how long to stay out of your home.  Ventilate the area well before re-entering.  Hemorrhoids:   Most over-the-counter preparations can be used during pregnancy.  Check your medication to see what is safe to use.  It is important to use a stool softener or fiber in your diet and to drink lots of liquids.  If hemorrhoids seem to be getting worse please call the office.   Hot Tubs:  Hot tubs Jacuzzis and saunas are not recommended while pregnant.  These increase your internal body temperature and should be avoided.  Intercourse:  Sexual intercourse is safe during pregnancy as long as you are comfortable, unless otherwise advised by your provider.  Spotting may occur after intercourse; report any bright red bleeding that is heavier  than spotting.  Labor:  If you know that you are in labor, please go to the hospital.  If you are unsure, please call the office and let us help you decide what to do.  Lifting, straining, etc:  If your job requires heavy lifting or straining please check with your provider for any limitations.  Generally, you should not lift items heavier than that you can lift simply with your hands and arms (no back muscles)  Painting:  Paint fumes do not harm your pregnancy, but may make you ill and should be avoided if possible.  Latex or water based paints have less odor than oils.  Use adequate ventilation while painting.  Permanents & Hair Color:  Chemicals in hair dyes are not recommended as they cause increase hair dryness which can increase hair loss during pregnancy.  " Highlighting" and permanents are allowed.  Dye may be absorbed differently and permanents may not hold as well during pregnancy.  Sunbathing:  Use a sunscreen, as skin burns easily during pregnancy.  Drink plenty of fluids; avoid over heating.  Tanning Beds:  Because their possible side effects are still unknown, tanning beds are not recommended.  Ultrasound Scans:  Routine ultrasounds are performed at approximately 20 weeks.  You will be able to see your baby's general anatomy an if you would like to know the gender this can usually be determined as well.  If it is questionable when you conceived you may also receive an ultrasound early in your pregnancy for dating purposes.  Otherwise ultrasound exams are not routinely performed unless there is a medical necessity.  Although you can request a scan we ask that you pay for it when conducted because insurance does not cover " patient request" scans.  Work: If your pregnancy proceeds without complications you may work until your due date, unless your physician or employer advises otherwise.  Round Ligament Pain/Pelvic Discomfort:  Sharp, shooting pains not associated  with bleeding are  fairly common, usually occurring in the second trimester of pregnancy.  They tend to be worse when standing up or when you remain standing for long periods of time.  These are the result of pressure of certain pelvic ligaments called "round ligaments".  Rest, Tylenol and heat seem to be the most effective relief.  As the womb and fetus grow, they rise out of the pelvis and the discomfort improves.  Please notify the office if your pain seems different than that described.  It may represent a more serious condition.  Common Medications Safe in Pregnancy  Acne:      Constipation:  Benzoyl Peroxide     Colace  Clindamycin      Dulcolax Suppository  Topica Erythromycin     Fibercon  Salicylic Acid      Metamucil         Miralax AVOID:        Senakot   Accutane    Cough:  Retin-A       Cough Drops  Tetracycline      Phenergan w/ Codeine if Rx  Minocycline      Robitussin (Plain & DM)  Antibiotics:     Crabs/Lice:  Ceclor       RID  Cephalosporins    AVOID:  E-Mycins      Kwell  Keflex  Macrobid/Macrodantin   Diarrhea:  Penicillin      Kao-Pectate  Zithromax      Imodium AD         PUSH FLUIDS AVOID:       Cipro     Fever:  Tetracycline      Tylenol (Regular or Extra  Minocycline       Strength)  Levaquin      Extra Strength-Do not          Exceed 8 tabs/24 hrs Caffeine:        200mg /day (equiv. To 1 cup of coffee or  approx. 3 12 oz sodas)         Gas: Cold/Hayfever:       Gas-X  Benadryl      Mylicon  Claritin       Phazyme  **Claritin-D        Chlor-Trimeton    Headaches:  Dimetapp      ASA-Free Excedrin  Drixoral-Non-Drowsy     Cold Compress  Mucinex (Guaifenasin)     Tylenol (Regular or Extra  Sudafed/Sudafed-12 Hour     Strength)  **Sudafed PE Pseudoephedrine   Tylenol Cold & Sinus     Vicks Vapor Rub  Zyrtec  **AVOID if Problems With Blood Pressure         Heartburn: Avoid lying down for at least 1 hour after meals  Aciphex      Maalox     Rash:  Milk of  Magnesia     Benadryl    Mylanta       1% Hydrocortisone Cream  Pepcid  Pepcid Complete   Sleep Aids:  Prevacid      Ambien   Prilosec       Benadryl  Rolaids       Chamomile Tea  Tums (Limit 4/day)     Unisom         Tylenol PM         Warm milk-add vanilla or  Hemorrhoids:       Sugar for taste  Anusol/Anusol H.C.  (RX: Analapram 2.5%)  Sugar Substitutes:  Hydrocortisone OTC     Ok in moderation  Preparation H      Tucks        Vaseline lotion applied to tissue with wiping    Herpes:     Throat:  Acyclovir      Oragel  Famvir  Valtrex     Vaccines:         Flu Shot Leg Cramps:       *Gardasil  Benadryl      Hepatitis A         Hepatitis B Nasal Spray:       Pneumovax  Saline Nasal Spray     Polio Booster         Tetanus Nausea:       Tuberculosis test or PPD  Vitamin B6 25 mg TID   AVOID:    Dramamine      *Gardasil  Emetrol       Live Poliovirus  Ginger Root 250 mg QID    MMR (measles, mumps &  High Complex Carbs @ Bedtime    rebella)  Sea Bands-Accupressure    Varicella (Chickenpox)  Unisom 1/2 tab TID     *No known complications           If received before Pain:         Known pregnancy;   Darvocet       Resume series after  Lortab        Delivery  Percocet    Yeast:   Tramadol      Femstat  Tylenol 3      Gyne-lotrimin  Ultram       Monistat  Vicodin           MISC:         All Sunscreens           Hair Coloring/highlights          Insect Repellant's          (Including DEET)         Mystic Tans

## 2023-06-17 DIAGNOSIS — Z419 Encounter for procedure for purposes other than remedying health state, unspecified: Secondary | ICD-10-CM | POA: Diagnosis not present

## 2023-06-29 ENCOUNTER — Other Ambulatory Visit: Payer: Self-pay

## 2023-06-29 ENCOUNTER — Emergency Department: Payer: Medicaid Other

## 2023-06-29 ENCOUNTER — Encounter: Payer: Self-pay | Admitting: Emergency Medicine

## 2023-06-29 DIAGNOSIS — D259 Leiomyoma of uterus, unspecified: Secondary | ICD-10-CM | POA: Insufficient documentation

## 2023-06-29 DIAGNOSIS — O200 Threatened abortion: Secondary | ICD-10-CM | POA: Diagnosis not present

## 2023-06-29 DIAGNOSIS — O26851 Spotting complicating pregnancy, first trimester: Secondary | ICD-10-CM | POA: Diagnosis present

## 2023-06-29 DIAGNOSIS — Z3A11 11 weeks gestation of pregnancy: Secondary | ICD-10-CM | POA: Diagnosis not present

## 2023-06-29 DIAGNOSIS — O3411 Maternal care for benign tumor of corpus uteri, first trimester: Secondary | ICD-10-CM | POA: Diagnosis not present

## 2023-06-29 LAB — BASIC METABOLIC PANEL
Anion gap: 8 (ref 5–15)
BUN: 11 mg/dL (ref 6–20)
CO2: 21 mmol/L — ABNORMAL LOW (ref 22–32)
Calcium: 8.8 mg/dL — ABNORMAL LOW (ref 8.9–10.3)
Chloride: 101 mmol/L (ref 98–111)
Creatinine, Ser: 0.74 mg/dL (ref 0.44–1.00)
GFR, Estimated: >60 mL/min (ref >60)
Glucose, Bld: 103 mg/dL — ABNORMAL HIGH (ref 70–99)
Potassium: 3.4 mmol/L — ABNORMAL LOW (ref 3.5–5.1)
Sodium: 130 mmol/L — ABNORMAL LOW (ref 135–145)

## 2023-06-29 LAB — CBC
HCT: 37.2 % (ref 36.0–46.0)
Hemoglobin: 12.1 g/dL (ref 12.0–15.0)
MCH: 27.3 pg (ref 26.0–34.0)
MCHC: 32.5 g/dL (ref 30.0–36.0)
MCV: 83.8 fL (ref 80.0–100.0)
Platelets: 369 10*3/uL (ref 150–400)
RBC: 4.44 MIL/uL (ref 3.87–5.11)
RDW: 14.6 % (ref 11.5–15.5)
WBC: 15.4 10*3/uL — ABNORMAL HIGH (ref 4.0–10.5)
nRBC: 0 % (ref 0.0–0.2)

## 2023-06-29 LAB — HCG, QUANTITATIVE, PREGNANCY: hCG, Beta Chain, Quant, S: 103371 m[IU]/mL — ABNORMAL HIGH (ref <5)

## 2023-06-29 NOTE — ED Triage Notes (Signed)
Pt to ED from work c/o abd pain and vaginal bleeding.  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  Pt then noticed some light pink blood on toilet paper after wiping.  Pt is [redacted]wks pregnant tomorrow, had a miscarriage back in May.

## 2023-06-30 ENCOUNTER — Emergency Department
Admission: EM | Admit: 2023-06-30 | Discharge: 2023-06-30 | Disposition: A | Discharge status: Home / Self Care | Payer: Medicaid Other | Attending: Emergency Medicine | Admitting: Emergency Medicine

## 2023-06-30 DIAGNOSIS — D259 Leiomyoma of uterus, unspecified: Secondary | ICD-10-CM

## 2023-06-30 DIAGNOSIS — O200 Threatened abortion: Secondary | ICD-10-CM

## 2023-06-30 LAB — TYPE AND SCREEN

## 2023-06-30 MED ORDER — ACETAMINOPHEN 325 MG PO TABS
650.0000 mg | ORAL_TABLET | Freq: Once | ORAL | Status: AC
  Administered 2023-06-30: 650 mg via ORAL
  Filled 2023-06-30: qty 2

## 2023-06-30 MED ORDER — ONDANSETRON 4 MG PO TBDP: 4.0000 mg | ORAL_TABLET | Freq: Three times a day (TID) | ORAL | 0 refills | Status: DC | PRN
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
## 2023-06-30 NOTE — Discharge Instructions (Signed)
You may take Tylenol as needed for discomfort.  You may take Zofran as needed for nausea.  Return to the ER for worsening symptoms, persistent vomiting, soaking more than 1 pad per hour, fainting or other concerns.

## 2023-07-04 ENCOUNTER — Ambulatory Visit (INDEPENDENT_AMBULATORY_CARE_PROVIDER_SITE_OTHER): Payer: Medicaid Other | Admitting: Advanced Practice Midwife

## 2023-07-04 ENCOUNTER — Encounter: Payer: Self-pay | Admitting: Advanced Practice Midwife

## 2023-07-04 ENCOUNTER — Other Ambulatory Visit (HOSPITAL_COMMUNITY)
Admission: RE | Admit: 2023-07-04 | Discharge: 2023-07-04 | Disposition: A | Discharge status: Home / Self Care | Payer: Medicaid Other | Source: Ambulatory Visit | Attending: Advanced Practice Midwife | Admitting: Advanced Practice Midwife

## 2023-07-04 VITALS — BP 141/91 | HR 89 | Wt 179.0 lb

## 2023-07-04 DIAGNOSIS — O99211 Obesity complicating pregnancy, first trimester: Secondary | ICD-10-CM

## 2023-07-04 DIAGNOSIS — Z113 Encounter for screening for infections with a predominantly sexual mode of transmission: Secondary | ICD-10-CM | POA: Insufficient documentation

## 2023-07-04 DIAGNOSIS — Z3480 Encounter for supervision of other normal pregnancy, unspecified trimester: Secondary | ICD-10-CM | POA: Diagnosis not present

## 2023-07-04 DIAGNOSIS — Z3A11 11 weeks gestation of pregnancy: Secondary | ICD-10-CM | POA: Diagnosis not present

## 2023-07-04 DIAGNOSIS — Z131 Encounter for screening for diabetes mellitus: Secondary | ICD-10-CM

## 2023-07-04 DIAGNOSIS — O161 Unspecified maternal hypertension, first trimester: Secondary | ICD-10-CM | POA: Diagnosis not present

## 2023-07-04 DIAGNOSIS — O99210 Obesity complicating pregnancy, unspecified trimester: Secondary | ICD-10-CM | POA: Insufficient documentation

## 2023-07-04 DIAGNOSIS — Z0283 Encounter for blood-alcohol and blood-drug test: Secondary | ICD-10-CM

## 2023-07-04 DIAGNOSIS — D573 Sickle-cell trait: Secondary | ICD-10-CM | POA: Insufficient documentation

## 2023-07-04 DIAGNOSIS — Z130 Encounter for screening for diseases of the blood and blood-forming organs and certain disorders involving the immune mechanism: Secondary | ICD-10-CM

## 2023-07-04 NOTE — Patient Instructions (Addendum)
Take Baby Aspirin (81 mg) daily)  Prenatal Care Prenatal care is health care during pregnancy. It helps you and your unborn baby (fetus) stay as healthy as possible. Prenatal care may be provided by a midwife, a family practice doctor, a Dispensing optician (nurse practitioner or physician assistant), or a childbirth and pregnancy doctor (obstetrician). How does this affect me? During pregnancy, you will be closely monitored for any new conditions that might develop. To lower your risk of pregnancy complications, you and your health care provider will talk about any underlying conditions you have. How does this affect my baby? Early and consistent prenatal care increases the chance that your baby will be healthy during pregnancy. Prenatal care lowers the risk that your baby will be: Born early (prematurely). Smaller than expected at birth (small for gestational age). What can I expect at the first prenatal care visit? Your first prenatal care visit will likely be the longest. You should schedule your first prenatal care visit as soon as you know that you are pregnant. Your first visit is a good time to talk about any questions or concerns you have about pregnancy. Medical history At your visit, you and your health care provider will talk about your medical history, including: Any past pregnancies. Your family's medical history. Medical history of the baby's father. Any long-term (chronic) health conditions you have and how you manage them. Any surgeries or procedures you have had. Any current over-the-counter or prescription medicines, herbs, or supplements that you are taking. Other factors that could pose a risk to your baby, including: Exposure to harmful chemicals or radiation at work or at home. Any substance use, including tobacco, alcohol, and drug use. Your home setting and your stress levels, including: Exposure to abuse or violence. Household financial strain. Your daily health  habits, including diet and exercise. Tests and screenings Your health care provider will: Measure your weight, height, and blood pressure. Do a physical exam, including a pelvic and breast exam. Perform blood tests and urine tests to check for: Urinary tract infection. Sexually transmitted infections (STIs). Low iron levels in your blood (anemia). Blood type and certain proteins on red blood cells (Rh antibodies). Infections and immunity to viruses, such as hepatitis B and rubella. HIV (human immunodeficiency virus). Discuss your options for genetic screening. Tips about staying healthy Your health care provider will also give you information about how to keep yourself and your baby healthy, including: Nutrition and taking vitamins. Physical activity. How to manage pregnancy symptoms such as nausea and vomiting (morning sickness). Infections and substances that may be harmful to your baby and how to avoid them. Food safety. Dental care. Working. Travel. Warning signs to watch for and when to call your health care provider. How often will I have prenatal care visits? After your first prenatal care visit, you will have regular visits throughout your pregnancy. The visit schedule is often as follows: Up to week 28 of pregnancy: once every 4 weeks. 28-36 weeks: once every 2 weeks. After 36 weeks: every week until delivery. Some women may have visits more or less often depending on any underlying health conditions and the health of the baby. Keep all follow-up and prenatal care visits. This is important. What happens during routine prenatal care visits? Your health care provider will: Measure your weight and blood pressure. Check for fetal heart sounds. Measure the height of your uterus in your abdomen (fundal height). This may be measured starting around week 20 of pregnancy. Check the position of  your baby inside your uterus. Ask questions about your diet, sleeping patterns, and  whether you can feel the baby move. Review warning signs to watch for and signs of labor. Ask about any pregnancy symptoms you are having and how you are dealing with them. Symptoms may include: Headaches. Nausea and vomiting. Vaginal discharge. Swelling. Fatigue. Constipation. Changes in your vision. Feeling persistently sad or anxious. Any discomfort, including back or pelvic pain. Bleeding or spotting. Make a list of questions to ask your health care provider at your routine visits. What tests might I have during prenatal care visits? You may have blood, urine, and imaging tests throughout your pregnancy, such as: Urine tests to check for glucose, protein, or signs of infection. Glucose tests to check for a form of diabetes that can develop during pregnancy (gestational diabetes mellitus). This is usually done around week 24 of pregnancy. Ultrasounds to check your baby's growth and development, to check for birth defects, and to check your baby's well-being. These can also help to decide when you should deliver your baby. A test to check for group B strep (GBS) infection. This is usually done around week 36 of pregnancy. Genetic testing. This may include blood, fluid, or tissue sampling, or imaging tests, such as an ultrasound. Some genetic tests are done during the first trimester and some are done during the second trimester. What else can I expect during prenatal care visits? Your health care provider may recommend getting certain vaccines during pregnancy. These may include: A yearly flu shot (annual influenza vaccine). This is especially important if you will be pregnant during flu season. Tdap (tetanus, diphtheria, pertussis) vaccine. Getting this vaccine during pregnancy can protect your baby from whooping cough (pertussis) after birth. This vaccine may be recommended between weeks 27 and 36 of pregnancy. A COVID-19 vaccine. Later in your pregnancy, your health care provider may  give you information about: Childbirth and breastfeeding classes. Choosing a health care provider for your baby. Umbilical cord banking. Breastfeeding. Birth control after your baby is born. The hospital labor and delivery unit and how to set up a tour. Registering at the hospital before you go into labor. Where to find more information Office on Women's Health: TravelLesson.ca American Pregnancy Association: americanpregnancy.org March of Dimes: marchofdimes.org Summary Prenatal care helps you and your baby stay as healthy as possible during pregnancy. Your first prenatal care visit will most likely be the longest. You will have visits and tests throughout your pregnancy to monitor your health and your baby's health. Bring a list of questions to your visits to ask your health care provider. Make sure to keep all follow-up and prenatal care visits. This information is not intended to replace advice given to you by your health care provider. Make sure you discuss any questions you have with your health care provider. Document Revised: 04/15/2020 Document Reviewed: 04/15/2020 Elsevier Patient Education  2024 [employer] Exercise During Pregnancy Exercise is an important part of being healthy for people of all ages. Exercise improves the function of your heart and lungs and helps you maintain strength, flexibility, and a healthy body weight. Exercise also boosts energy levels and elevates mood. Most women should exercise regularly during pregnancy. Exercise routines may need to change as your pregnancy progresses. In rare cases, women with certain medical conditions or complications may be asked to limit or avoid exercise during pregnancy. Your health care provider will give you information on what will work for you. How does this affect me? Along with  maintaining general strength and flexibility, exercising during pregnancy can help: Keep strength in muscles that are used during labor and  childbirth. Decrease low back pain or symptoms of depression. Control weight gain during pregnancy. Reduce the risk of needing insulin if you develop diabetes during pregnancy. Decrease the risk of cesarean delivery. Speed up your recovery after giving birth. Relieve constipation. How does this affect my baby? Exercise can help you have a healthy pregnancy. Exercise does not cause early (premature) birth. It will not cause your baby to weigh less at birth. What exercises can I do? Many exercises are safe for you to do during pregnancy. Do a variety of exercises that safely increase your heart and breathing rates and help you build and maintain muscle strength. Do exercises exactly as told by your health care provider. You may do these exercises: Walking. Swimming. Water aerobics. Riding a stationary bike. Modified yoga or Pilates. Tell your instructor that you are pregnant. Avoid overstretching, and avoid lying on your back for long periods of time. Running or jogging. Choose this type of exercise only if: You ran or jogged regularly before your pregnancy. You can run or jog and still talk in complete sentences. What exercises should I avoid? You may be told to limit high-intensity exercise depending on your level of fitness and whether you exercised regularly before you were pregnant. You can tell that you are exercising at a high intensity if you are breathing much harder and faster and cannot hold a conversation while exercising. You must avoid: Contact sports. Activities that put you at risk for falling on or being hit in the belly, such as downhill skiing, waterskiing, surfing, rock climbing, cycling, gymnastics, and horseback riding. Scuba diving. Skydiving. Hot yoga or hot Pilates. These activities take place in a room that is heated to high temperatures. Jogging or running, unless you jogged or ran regularly before you were pregnant. While jogging or running, you should always be  able to talk in full sentences. Do not run or jog so fast that you are unable to have a conversation. Do not exercise at more than 6,000 feet above sea level (high elevation) if you are not used to exercising at high elevation. How do I exercise in a safe way?  Avoid overheating. Do not exercise in very high temperatures. Wear loose-fitting, breathable clothes. Avoid dehydration. Drink enough fluid before, during, and after exercise to keep your urine pale yellow. Avoid overstretching. Because of hormone changes during pregnancy, it is easy to overstretch muscles, tendons, and ligaments. Start slowly and ask your health care provider to recommend the types of exercise that are safe for you. Do not exercise to lose weight. Wear a sports bra to support your breasts. Avoid standing still or lying flat on your back as much as you can. Follow these instructions at home: Exercise on most days or all days of the week. Try to exercise for 30 minutes a day, 5 days a week, unless your health care provider tells you not to. If you actively exercised before your pregnancy and you are healthy, your health care provider may tell you to continue to do moderate-intensity to high-intensity exercise. If you are just starting to exercise or did not exercise much before your pregnancy, your health care provider may tell you to do low-intensity to moderate-intensity exercise. Questions to ask your health care provider Is exercise safe for me? What are signs that I should stop exercising? Does my health condition mean that I should  not exercise during pregnancy? When should I avoid exercising during pregnancy? Stop exercising and contact a health care provider if: You have any unusual symptoms such as: Mild contractions of the uterus or cramps in the abdomen. A dizzy feeling that does not go away when you rest. Stop exercising and get help right away if: You have any unusual symptoms such as: Sudden, severe  pain in your low back or your belly. Regular, painful contractions of your uterus. Chest pain. Bleeding or fluid leaking from your vagina. Shortness of breath. Headache. Pain and swelling of your calves. Summary Most women should exercise regularly throughout pregnancy. In rare cases, women with certain medical conditions or complications may be asked to limit or avoid exercise during pregnancy. Do not exercise to lose weight during pregnancy. Your health care provider will tell you what level of physical activity is right for you. Stop exercising and contact a health care provider if you have unusual symptoms, such as mild contractions or dizziness. This information is not intended to replace advice given to you by your health care provider. Make sure you discuss any questions you have with your health care provider. Document Revised: 02/18/2020 Document Reviewed: 02/18/2020 Elsevier Patient Education  2024 [employer] Inc. Eating Plan for Pregnant Women While you are pregnant, your body requires additional nutrition to help support your growing baby. You also have a higher need for some vitamins and minerals, such as folic acid, calcium, iron, and vitamin D. Eating a healthy, well-balanced diet is very important for your health and your baby's health. Your need for extra calories varies over the course of your pregnancy. Pregnancy is divided into three trimesters, with each trimester lasting 3 months. For most women, it is recommended to consume: 150 extra calories a day during the first trimester. 300 extra calories a day during the second trimester. 300 extra calories a day during the third trimester. What are tips for following this plan? Cooking Practice good food safety and cleanliness. Wash your hands before you eat and after you prepare raw meat. Wash all fruits and vegetables well before peeling or eating. Taking these actions can help to prevent foodborne illnesses that can be very  dangerous to your baby, such as listeriosis. Ask your health care provider for more information about listeriosis. Make sure that all meats, poultry, and eggs are cooked to food-safe temperatures or "well-done." Meal planning  Eat a variety of foods (especially fruits and vegetables) to get a full range of vitamins and minerals. Two or more servings of fish are recommended each week in order to get the most benefits from omega-3 fatty acids that are found in seafood. Choose fish that are lower in mercury, such as salmon and pollock. Limit your overall intake of foods that have "empty calories." These are foods that have little nutritional value, such as sweets, desserts, candies, and sugar-sweetened beverages. Drinks that contain caffeine are okay to drink, but it is better to avoid caffeine. Keep your total caffeine intake to less than 200 mg each day (which is 12 oz or 355 mL of coffee, tea, or soda) or the limit as told by your health care provider. General information Do not try to lose weight or go on a diet during pregnancy. Take a prenatal vitamin to help meet your additional vitamin and mineral needs during pregnancy, specifically for folic acid, iron, calcium, and vitamin D. Remember to stay active. Ask your health care provider what types of exercise and activities are safe for you.  What does 150 extra calories look like? Healthy options that provide 150 extra calories each day could be any of the following: 6-8 oz (170-227 g) plain low-fat yogurt with  cup (70 g) berries. 1 apple with 2 tsp (11 g) peanut butter. Cut-up vegetables with  cup (60 g) hummus. 8 fl oz (237 mL) low-fat chocolate milk. 1 stick of string cheese with 1 medium orange. 1 peanut butter and jelly sandwich that is made with one slice of whole-wheat bread and 1 tsp (5 g) of peanut butter. For 300 extra calories, you could eat two of these healthy options each day. What is a healthy amount of weight to gain? The  right amount of weight gain for you is based on your BMI (body mass index) before you became pregnant. If your BMI was less than 18 (underweight), you should gain 28-40 lb (13-18 kg). If your BMI was 18-24.9 (normal), you should gain 25-35 lb (11-16 kg). If your BMI was 25-29.9 (overweight), you should gain 15-25 lb (7-11 kg). If your BMI was 30 or greater (obese), you should gain 11-20 lb (5-9 kg). What if I am having twins or multiples? Generally, if you are carrying twins or multiples: You may need to eat 300-600 extra calories a day. The recommended range for total weight gain is 25-54 lb (11-25 kg), depending on your BMI before pregnancy. Talk with your health care provider to find out about nutritional needs, weight gain, and exercise that is right for you. What foods should I eat?  Fruits All fruits. Eat a variety of colors and types of fruit. Remember to wash your fruits well before peeling or eating. Vegetables All vegetables. Eat a variety of colors and types of vegetables. Remember to wash your vegetables well before peeling or eating. Grains All grains. Choose whole grains, such as whole-wheat bread, oatmeal, or brown rice. Meats and other protein foods Lean meats, including chicken, [place], and lean cuts of beef, veal, or pork. Fish that is higher in omega-3 fatty acids and lower in mercury, such as salmon, herring, mussels, trout, sardines, pollock, shrimp, crab, and lobster. Tofu. Tempeh. Beans. Eggs. Peanut butter and other nut butters. Dairy Pasteurized milk and milk alternatives, such as almond milk. Pasteurized yogurt and pasteurized cheese. Cottage cheese. Sour cream. Beverages Water. Juices that contain 100% fruit juice or vegetable juice. Caffeine-free teas and decaffeinated coffee. Fats and oils Fats and oils are okay to include in moderation. Sweets and desserts Sweets and desserts are okay to include in moderation. Seasoning and other foods All pasteurized  condiments. The items listed above may not be a complete list of foods and beverages you can eat. Contact a dietitian for more information. What foods should I avoid? Fruits Raw (unpasteurized) fruit juices. Vegetables Unpasteurized vegetable juices. Meats and other protein foods Precooked or cured meat, such as bologna, hot dogs, sausages, or meat loaves. (If you must eat those meats, reheat them until they are steaming hot.) Refrigerated pate, meat spreads from a meat counter, or smoked seafood that is found in the refrigerated section of a store. Raw or undercooked meats, poultry, and eggs. Raw fish, such as sushi or sashimi. Fish that have high mercury content, such as tilefish, shark, swordfish, and king mackerel. Dairy Unpasteurized milk and any foods that have unpasteurized milk in them. Soft cheeses, such as feta, queso blanco, queso fresco, [place], [place], panela, and blue-veined cheeses (unless they are made with pasteurized milk, which must be stated on the label). Beverages Alcohol.  Sugar-sweetened beverages, such as sodas, teas, or energy drinks. Seasoning and other foods Homemade fermented foods and drinks, such as pickles, sauerkraut, or kombucha drinks. (Store-bought pasteurized versions of these are okay.) Salads that are made in a store or deli, such as ham salad, chicken salad, egg salad, tuna salad, and seafood salad. The items listed above may not be a complete list of foods and beverages you should avoid. Contact a dietitian for more information. Where to find more information To calculate the number of calories you need based on your height, weight, and activity level, you can use an online calculator such as: PayStrike.dk To calculate how much weight you should gain during pregnancy, you can use an online pregnancy weight gain calculator such as: http://www.harvey.com/ To learn more about eating fish during pregnancy, talk with your health care provider or  visit: PumpkinSearch.com.ee Summary While you are pregnant, your body requires additional nutrition to help support your growing baby. Eat a variety of foods, especially fruits and vegetables, to get a full range of vitamins and minerals. Practice good food safety and cleanliness. Wash your hands before you eat and after you prepare raw meat. Wash all fruits and vegetables well before peeling or eating. Taking these actions can help to prevent foodborne illnesses, such as listeriosis, that can be very dangerous to your baby. Do not eat raw meat or fish. Do not eat fish that have high mercury content, such as tilefish, shark, swordfish, and king mackerel. Do not eat raw (unpasteurized) dairy. Take a prenatal vitamin to help meet your additional vitamin and mineral needs during pregnancy, specifically for folic acid, iron, calcium, and vitamin D. This information is not intended to replace advice given to you by your health care provider. Make sure you discuss any questions you have with your health care provider. Document Revised: 01/29/2020 Document Reviewed: 01/29/2020 Elsevier Patient Education  2024 [employer].

## 2023-07-04 NOTE — Progress Notes (Signed)
Alexis Elliott  New Obstetric Patient H&P    Chief Complaint: "Desires prenatal care"   History of Present Illness: Patient is a 32 y.o. [phone] Not Hispanic or Latino female, presents with amenorrhea and positive home pregnancy test. Patient's last menstrual period was 04/14/2023 (exact date). and based on her  LMP, her EDD is Estimated Date of Delivery: 01/19/24 and her EGA is [redacted]w[redacted]d. Cycles are 6-7 days, regular, and occur approximately every : 28 days. Her last pap smear was 3 years ago and was no abnormalities.    She had a urine pregnancy test which was positive 5 or 6 week(s)  ago. Her last menstrual period was normal and lasted for  6 or 7 day(s). Since her LMP she claims she has experienced breast tenderness, fatigue, nausea, vomiting. She denies vaginal bleeding. Her past medical history is noncontributory. Her prior pregnancies are notable for  FT SVD 2008, IABs in 2019, 2021, 2022, Ectopic 2024.  Since her LMP, she admits to the use of tobacco products  no She claims she has gained   3  pounds since the start of her pregnancy.  There are cats in the home in the home  no  She admits close contact with children on a regular basis  yes  She has had chicken pox in the past no She has had Tuberculosis exposures, symptoms, or previously tested positive for TB   no Current or past history of domestic violence. no  Genetic Screening/Teratology Counseling: (Includes patient, baby's father, or anyone in either family with:)   1. Patient's age >/= 58 at conception:  no 2. Thalassemia ([place], [place], Mediterranean, or Asian background): MCV<80  no 3. Neural tube defect (meningomyelocele, spina bifida, anencephaly)  no 4. Congenital heart defect  no  5. Down syndrome  no 6. Tay-Sachs (Jewish, [place]  no 7. Canavan's Disease  no 8. Sickle cell disease or trait (African)  Patient with trait  9. Hemophilia or other blood disorders  no  10. Muscular dystrophy  no  11. Cystic  fibrosis  no  12. Huntington's Chorea  no  13. Mental retardation/autism  no 14. Other inherited genetic or chromosomal disorder  no 15. Maternal metabolic disorder (DM, PKU, etc)  no 16. Patient or FOB with a child with a birth defect not listed above no  16a. Patient or FOB with a birth defect themselves no 17. Recurrent pregnancy loss, or stillbirth  no  18. Any medications since LMP other than prenatal vitamins (include vitamins, supplements, OTC meds, drugs, alcohol)  Marijuana use- quit with +UPT 19. Any other genetic/environmental exposure to discuss  no  Infection History:   1. Lives with someone with TB or TB exposed  no  2. Patient or partner has history of genital herpes  no 3. Rash or viral illness since LMP  no 4. History of STI (GC, CT, HPV, syphilis, HIV)  remote history of GC/CT 5. History of recent travel :  no  Other pertinent information:  works as Lawyer at [employer]    Review of Systems:10 point review of systems negative unless otherwise noted in HPI  Past Medical History:  Patient Active Problem List   Diagnosis Date Noted Date Diagnosed   Obesity affecting pregnancy 07/04/2023    Sickle cell trait [facility])     Supervision of other normal pregnancy, antepartum 06/13/2023      Clinical Staff Provider  Office Location   [facility] Dating  By LMP c/w 6w u/s  Language  English Anatomy US    Flu Vaccine  offer Genetic Screen  NIPS:   TDaP vaccine   offer Hgb A1C or  GTT Early : Third trimester :   Covid No boosters   LAB RESULTS   Rhogam  --/--/A POS (04/30 1435)  Blood Type --/--/A POS (04/30 1435)   RSV offer Antibody NEG (04/30 1435)  Feeding Plan formula Rubella    Contraception depo RPR   Non reactive  08/16/21  Circumcision yes HBsAg     Pediatrician  [facility] HIV  Non reactive  08/16/21  Support Person [place] Varicella  POSITIVE  03/01/20  Prenatal Classes no GBS  (For PCN allergy, check sensitivities)     Hep C     BTL  Consent  Pap NEG; HPV NEG  10/03/19  VBAC Consent  Hgb Electro      CF   GC/CHLM NEG/NEG  01/14/22 SMA     HBsAb Reactive; quant >1,000.00        Past Surgical History:  Past Surgical History:  Procedure Laterality Date   DIAGNOSTIC LAPAROSCOPY WITH REMOVAL OF ECTOPIC PREGNANCY Left 11/14/2022   Procedure: DIAGNOSTIC LAPAROSCOPY, LEFT SALPINGECTOMY WITH REMOVAL OF ECTOPIC PREGNANCY;  Surgeon: Hildred Laser, MD;  Location: [facility] ORS;  Service: Gynecology;  Laterality: Left;   FINGER SURGERY      Gynecologic History: Patient's last menstrual period was 04/14/2023 (exact date).  Obstetric History: [id number]  Family History:  Family History  Problem Relation Age of Onset   Hypertension Mother    Sickle cell trait Father    Healthy Sister    Post-traumatic stress disorder Sister    Diabetes Maternal Grandmother    Hypertension Maternal Grandmother    Healthy Maternal Grandfather    Hypertension Paternal Grandmother    Diabetes Paternal Grandmother    Hypertension Paternal Actor    Healthy Half-Sister     Social History:  Social History   Socioeconomic History   Marital status: Single    Spouse name: Not on file   Number of children: 1   Years of education: 12   Highest education level: Not on file  Occupational History   Occupation: cna; med [employer]  Tobacco Use   Smoking status: Never   Smokeless tobacco: Never  Vaping Use   Vaping status: Never Used  Substance and Sexual Activity   Alcohol use: Not Currently    Comment: social drinker   Drug use: Not Currently    Frequency: 2.0 times per week    Types: Marijuana   Sexual activity: Yes    Partners: Male    Birth control/protection: Injection  Other Topics Concern   Not on file  Social History Narrative   Not on file   Social Drivers of Health   Financial Resource Strain: Low Risk  (06/13/2023)   Overall Financial Resource Strain (CARDIA)    Difficulty of Paying Living Expenses: Not very hard  Food  Insecurity: No Food Insecurity (06/13/2023)   Hunger Vital Sign    Worried About Running Out of Food in the Last Year: Never true    Ran Out of Food in the Last Year: Never true  Transportation Needs: No Transportation Needs (06/13/2023)   PRAPARE - Administrator, Civil Service (Medical): No    Lack of Transportation (Non-Medical): No  Physical Activity: Insufficiently Active (06/13/2023)   Exercise Vital Sign    Days of Exercise per Week: 2 days    Minutes of Exercise per Session:  50 min  Stress: No Stress Concern Present (06/13/2023)   [employer] of Occupational Health - Occupational Stress Questionnaire    Feeling of Stress : Not at all  Social Connections: Moderately Isolated (06/13/2023)   Social Connection and Isolation Panel [NHANES]    Frequency of Communication with Friends and Family: More than three times a week    Frequency of Social Gatherings with Friends and Family: Not on file    Attends Religious Services: Never    Database administrator or Organizations: No    Attends Banker Meetings: Never    Marital Status: Living with partner  Intimate Partner Violence: Not At Risk (06/13/2023)   Humiliation, Afraid, Rape, and Kick questionnaire    Fear of Current or Ex-Partner: No    Emotionally Abused: No    Physically Abused: No    Sexually Abused: No    Allergies:  No Known Allergies  Medications: Prior to Admission medications   Medication Sig Start Date End Date Taking? Authorizing Provider  acetaminophen (TYLENOL) 325 MG tablet Take 650 mg by mouth every 6 (six) hours as needed.   Yes [provider]  ondansetron (ZOFRAN-ODT) 4 MG disintegrating tablet Take 1 tablet (4 mg total) by mouth every 8 (eight) hours as needed for nausea or vomiting. 06/30/23  Yes Irean Hong, MD  prenatal vitamin w/FE, FA (NATACHEW) 29-1 MG CHEW chewable tablet Chew 1 tablet by mouth daily at 12 noon.   Yes [provider]    Physical  Exam Vitals: Blood pressure (!) 141/91, pulse 89, weight 179 lb (81.2 kg), last menstrual period 04/14/2023.  General: NAD HEENT: normocephalic, anicteric Thyroid: no enlargement, no palpable nodules Pulmonary: No increased work of breathing, CTAB Cardiovascular: RRR, distal pulses 2+ Abdomen: NABS, soft, non-tender, non-distended.  FHTs 160s  Genitourinary: self swab Aptima Extremities: no edema, erythema, or tenderness Neurologic: Grossly intact Psychiatric: mood appropriate, affect full   The following were addressed during this visit:  Breastfeeding Education - Early initiation of breastfeeding    Comments: Keeps milk supply adequate, helps contract uterus and slow bleeding, and early milk is the perfect first food and is easy to digest.   - The importance of exclusive breastfeeding    Comments: Provides antibodies, Lower risk of breast and ovarian cancers, and type-2 diabetes,Helps your body recover, Reduced chance of SIDS.   - Risks of giving your baby anything other than breast milk if you are breastfeeding    Comments: Make the baby less content with breastfeeds, may make my baby more susceptible to illness, and may reduce my milk supply.   - The importance of early skin-to-skin contact    Comments:  Keeps baby warm and secure, helps keep baby's blood sugar up and breathing steady, easier to bond and breastfeed, and helps calm baby.  - Rooming-in on a 24-hour basis    Comments: Easier to learn baby's feeding cues, easier to bond and get to know each other, and encourages milk production.   - Feeding on demand or baby-led feeding    Comments: Helps prevent breastfeeding complications, helps bring in good milk supply, prevents under or overfeeding, and helps baby feel content and satisfied   - Frequent feeding to help assure optimal milk production    Comments: Making a full supply of milk requires frequent removal of milk from breasts, infant will eat 8-12 times in  24 hours, if separated from infant use breast massage, hand expression and/ or pumping to remove milk  from breasts.   - Effective positioning and attachment    Comments: Helps my baby to get enough breast milk, helps to produce an adequate milk supply, and helps prevent nipple pain and damage   - Exclusive breastfeeding for the first 6 months    Comments: Builds a healthy milk supply and keeps it up, protects baby from sickness and disease, and breastmilk has everything your baby needs for the first 6 months.  - Individualized Education    Comments: Contraindications to breastfeeding and other special medical conditions. Patient reports she plans to formula feed as she wants to go back to work as soon as possible and doesn't want to pump. She does accept initial education.     Assessment: 32 y.o. [id number] at [redacted]w[redacted]d presenting to initiate prenatal care  Plan: 1) Avoid alcoholic beverages. 2) Patient encouraged not to smoke.  3) Discontinue the use of all non-medicinal drugs and chemicals.  4) Take prenatal vitamins daily.  5) Nutrition, food safety (fish, cheese advisories, and high nitrite foods) and exercise discussed. 6) Hospital and practice style discussed with cross coverage system.  7) Genetic Screening, such as with 1st Trimester Screening, cell free fetal DNA, AFP testing, and Ultrasound, as well as with amniocentesis and CVS as appropriate, is discussed with patient. At the conclusion of today's visit patient requested genetic testing 8) Patient is asked about travel to areas at risk for the Zika virus, and counseled to avoid travel and exposure to mosquitoes or sexual partners who may have themselves been exposed to the virus. Testing is discussed, and will be ordered as appropriate.  9) Elevated BP- no previous diagnosis of hypertension: baby ASA daily, baseline PIH labs 10) Obesity BMI 32: Hgb A1C 11) NOB labs 12) Return to clinic in 4 weeks for ROB with MD- discuss need for  antihypertensive   Tresea Mall, CNM [place] [facility] 07/04/2023 1:33 PM

## 2023-07-05 LAB — CERVICOVAGINAL ANCILLARY ONLY
Bacterial Vaginitis (gardnerella): POSITIVE — AB
Candida Glabrata: NEGATIVE
Candida Vaginitis: NEGATIVE
Chlamydia: NEGATIVE
Comment: NEGATIVE
Comment: NEGATIVE
Comment: NEGATIVE
Comment: NEGATIVE
Comment: NEGATIVE
Comment: NORMAL
Neisseria Gonorrhea: NEGATIVE
Trichomonas: NEGATIVE

## 2023-07-05 LAB — URINALYSIS, ROUTINE W REFLEX MICROSCOPIC
Bilirubin, UA: NEGATIVE
Ketones, UA: NEGATIVE
Leukocytes,UA: NEGATIVE
Nitrite, UA: NEGATIVE
RBC, UA: NEGATIVE
Specific Gravity, UA: >=1.03 — AB (ref 1.005–1.030)
Urobilinogen, Ur: 1 mg/dL (ref 0.2–1.0)
pH, UA: 6 (ref 5.0–7.5)

## 2023-07-05 LAB — PROTEIN / CREATININE RATIO, URINE
Creatinine, Urine: 266.9 mg/dL
Protein, Ur: 24.8 mg/dL
Protein/Creat Ratio: 93 mg/g{creat} (ref 0–200)

## 2023-07-06 ENCOUNTER — Other Ambulatory Visit: Payer: Self-pay | Admitting: Advanced Practice Midwife

## 2023-07-06 DIAGNOSIS — B9689 Other specified bacterial agents as the cause of diseases classified elsewhere: Secondary | ICD-10-CM

## 2023-07-06 MED ORDER — METRONIDAZOLE 0.75 % VA GEL: 1.0000 | Freq: Every day | VAGINAL | 0 refills | Status: AC

## 2023-07-06 NOTE — Progress Notes (Signed)
Rx metro gel to treat BV. Message to patient.

## 2023-07-07 LAB — CULTURE, OB URINE

## 2023-07-07 LAB — URINE CULTURE, OB REFLEX

## 2023-07-08 LAB — CBC/D/PLT+RPR+RH+ABO+RUBIGG...
Antibody Screen: NEGATIVE
Basophils Absolute: 0.1 10*3/uL (ref 0.0–0.2)
Basos: 1 %
EOS (ABSOLUTE): 0.1 10*3/uL (ref 0.0–0.4)
Eos: 1 %
HCV Ab: NONREACTIVE
HIV Screen 4th Generation wRfx: NONREACTIVE
Hematocrit: 39.5 % (ref 34.0–46.6)
Hemoglobin: 12.6 g/dL (ref 11.1–15.9)
Hepatitis B Surface Ag: NEGATIVE
Immature Grans (Abs): 0.1 10*3/uL (ref 0.0–0.1)
Immature Granulocytes: 1 %
Lymphocytes Absolute: 2.3 10*3/uL (ref 0.7–3.1)
Lymphs: 20 %
MCH: 26.8 pg (ref 26.6–33.0)
MCHC: 31.9 g/dL (ref 31.5–35.7)
MCV: 84 fL (ref 79–97)
Monocytes Absolute: 0.8 10*3/uL (ref 0.1–0.9)
Monocytes: 7 %
Neutrophils Absolute: 8.4 10*3/uL — ABNORMAL HIGH (ref 1.4–7.0)
Neutrophils: 70 %
Platelets: 324 10*3/uL (ref 150–450)
RBC: 4.7 x10E6/uL (ref 3.77–5.28)
RDW: 12.6 % (ref 11.7–15.4)
RPR Ser Ql: NONREACTIVE
Rh Factor: POSITIVE
Rubella Antibodies, IGG: 6.89 {index} (ref >0.99)
Varicella zoster IgG: REACTIVE
WBC: 11.7 10*3/uL — ABNORMAL HIGH (ref 3.4–10.8)

## 2023-07-08 LAB — COMPREHENSIVE METABOLIC PANEL
ALT: 25 [IU]/L (ref 0–32)
AST: 15 [IU]/L (ref 0–40)
Albumin: 4.1 g/dL (ref 3.9–4.9)
Alkaline Phosphatase: 78 [IU]/L (ref 44–121)
BUN/Creatinine Ratio: 10 (ref 9–23)
BUN: 8 mg/dL (ref 6–20)
Bilirubin Total: 0.4 mg/dL (ref 0.0–1.2)
CO2: 20 mmol/L (ref 20–29)
Calcium: 9.6 mg/dL (ref 8.7–10.2)
Chloride: 105 mmol/L (ref 96–106)
Creatinine, Ser: 0.77 mg/dL (ref 0.57–1.00)
Globulin, Total: 2.9 g/dL (ref 1.5–4.5)
Glucose: 101 mg/dL — ABNORMAL HIGH (ref 70–99)
Potassium: 4.2 mmol/L (ref 3.5–5.2)
Sodium: 140 mmol/L (ref 134–144)
Total Protein: 7 g/dL (ref 6.0–8.5)
eGFR: 104 mL/min/{1.73_m2} (ref >59)

## 2023-07-08 LAB — HGB FRACTIONATION CASCADE
Hgb A2: 2.7 % (ref 1.8–3.2)
Hgb A: 97.3 % (ref 96.4–98.8)
Hgb F: 0 % (ref 0.0–2.0)
Hgb S: 0 %

## 2023-07-08 LAB — HEMOGLOBIN A1C
Est. average glucose Bld gHb Est-mCnc: 120 mg/dL
Hgb A1c MFr Bld: 5.8 % — ABNORMAL HIGH (ref 4.8–5.6)

## 2023-07-08 LAB — HCV INTERPRETATION

## 2023-07-12 ENCOUNTER — Encounter: Payer: Self-pay | Admitting: Advanced Practice Midwife

## 2023-07-12 ENCOUNTER — Other Ambulatory Visit: Payer: Self-pay | Admitting: Advanced Practice Midwife

## 2023-07-12 DIAGNOSIS — R7309 Other abnormal glucose: Secondary | ICD-10-CM | POA: Insufficient documentation

## 2023-07-12 LAB — MATERNIT 21 PLUS CORE, BLOOD
Fetal Fraction: 16
Result (T21): NEGATIVE
Trisomy 13 (Patau syndrome): NEGATIVE
Trisomy 18 (Edwards syndrome): NEGATIVE
Trisomy 21 (Down syndrome): NEGATIVE

## 2023-07-12 NOTE — Progress Notes (Signed)
1 hr gtt ordered- hgb A1c in prediabetic range

## 2023-07-18 DIAGNOSIS — Z419 Encounter for procedure for purposes other than remedying health state, unspecified: Secondary | ICD-10-CM | POA: Diagnosis not present

## 2023-07-19 ENCOUNTER — Other Ambulatory Visit: Payer: Medicaid Other

## 2023-07-19 DIAGNOSIS — R7309 Other abnormal glucose: Secondary | ICD-10-CM | POA: Diagnosis not present

## 2023-07-20 LAB — GLUCOSE, 1 HOUR GESTATIONAL: Gestational Diabetes Screen: 94 mg/dL (ref 70–139)

## 2023-07-23 LAB — NICOTINE SCREEN, URINE

## 2023-07-23 LAB — MONITOR DRUG PROFILE 14(MW)

## 2023-08-01 ENCOUNTER — Encounter: Payer: Medicaid Other | Admitting: Obstetrics

## 2023-08-03 ENCOUNTER — Encounter: Payer: Self-pay | Admitting: Obstetrics and Gynecology

## 2023-08-03 ENCOUNTER — Ambulatory Visit (INDEPENDENT_AMBULATORY_CARE_PROVIDER_SITE_OTHER): Payer: Medicaid Other | Admitting: Obstetrics and Gynecology

## 2023-08-03 VITALS — BP 138/85 | HR 109 | Wt 189.0 lb

## 2023-08-03 DIAGNOSIS — O99212 Obesity complicating pregnancy, second trimester: Secondary | ICD-10-CM

## 2023-08-03 DIAGNOSIS — Z1379 Encounter for other screening for genetic and chromosomal anomalies: Secondary | ICD-10-CM

## 2023-08-03 DIAGNOSIS — Z3A15 15 weeks gestation of pregnancy: Secondary | ICD-10-CM

## 2023-08-03 DIAGNOSIS — D573 Sickle-cell trait: Secondary | ICD-10-CM

## 2023-08-03 DIAGNOSIS — Z3689 Encounter for other specified antenatal screening: Secondary | ICD-10-CM

## 2023-08-03 DIAGNOSIS — O10912 Unspecified pre-existing hypertension complicating pregnancy, second trimester: Secondary | ICD-10-CM

## 2023-08-03 DIAGNOSIS — O0992 Supervision of high risk pregnancy, unspecified, second trimester: Secondary | ICD-10-CM

## 2023-08-03 DIAGNOSIS — O10919 Unspecified pre-existing hypertension complicating pregnancy, unspecified trimester: Secondary | ICD-10-CM | POA: Insufficient documentation

## 2023-08-03 MED ORDER — NIFEDIPINE ER OSMOTIC RELEASE 30 MG PO TB24: 30.0000 mg | ORAL_TABLET | Freq: Every day | ORAL | 3 refills | Status: AC

## 2023-08-03 MED ORDER — BLOOD PRESSURE KIT DEVI: 1.0000 | Freq: Every day | 0 refills | Status: AC

## 2023-08-03 NOTE — Progress Notes (Signed)
ROB: Patient is a 33 y.o. [id number] at [redacted]w[redacted]d who presents for routine OB care.  Pregnancy is complicated by has Supervision of high risk pregnancy, antepartum; Obesity affecting pregnancy; Sickle cell trait (HCC).    Patient has denies major complaints.  She had elevated BPs noted at last visit. On review of chart, has had elevated BPs for at almost the past year. Discussed with patient likely diagnosis of HTN.  Is currently taking daily baby aspirin, however will now prescribe Procardia XL 30 mg daily.  Discussed risks of HTN in pregnancy, and need for antenatal surveillance in 3rd trimester. Patient and partner note understanding. Patient also with elevated A1c on NOB, had normal early 1 hr glucola, for repeat at 28 weeks. Baseline PIH labs done last visit. Patient with [place] trait, encouraged partner to be screened as he is unaware of status. AFP discussed, ordered. Otherwise normal genetics. AFP discussed, ordered.  RTC in 4 weeks for ROB and anatomy scan. RTC in 1-2 weeks for BP check, BP cuff ordered and BP log given with instructions.

## 2023-08-03 NOTE — Progress Notes (Signed)
ROB [redacted]w[redacted]d: She is doing well. She has some headaches on and off and swelling in her feet when she wakes up in the morning.

## 2023-08-08 ENCOUNTER — Encounter: Payer: Self-pay | Admitting: Obstetrics and Gynecology

## 2023-08-08 LAB — AFP, SERUM, OPEN SPINA BIFIDA
AFP MoM: 0.75
AFP Value: 23.2 ng/mL
Gest. Age on Collection Date: 15.9 wk
Maternal Age At EDD: 34.7 a
OSBR Risk 1 IN: 10000
Test Results:: NEGATIVE
Weight: 189 [lb_av]

## 2023-08-10 ENCOUNTER — Ambulatory Visit (INDEPENDENT_AMBULATORY_CARE_PROVIDER_SITE_OTHER): Payer: Medicaid Other

## 2023-08-10 VITALS — BP 127/74 | HR 106 | Ht 62.0 in | Wt 191.0 lb

## 2023-08-10 DIAGNOSIS — R519 Headache, unspecified: Secondary | ICD-10-CM

## 2023-08-10 DIAGNOSIS — Z0130 Encounter for examination of blood pressure without abnormal findings: Secondary | ICD-10-CM

## 2023-08-10 MED ORDER — METOCLOPRAMIDE HCL 10 MG PO TABS: 10.0000 mg | ORAL_TABLET | Freq: Four times a day (QID) | ORAL | 2 refills | Status: DC | PRN

## 2023-08-10 NOTE — Progress Notes (Signed)
    NURSE VISIT NOTE  Subjective:    Patient ID: Alexis Elliott, female    DOB: 21-Jun-1991, 33 y.o.   MRN: [id number]  HPI  Patient is a 33 y.o. [phone] female who presents for BP check per order from Hildred Laser, MD.   Patient reports compliance with prescribed BP medications: yes Procardia 30 mgdaily Last dose of BP medication:  08/10/23 at 8:30am   BP Readings from Last 3 Encounters:  08/10/23 127/74  08/03/23 138/85  07/04/23 (!) 141/91   Pulse Readings from Last 3 Encounters:  08/10/23 (!) 106  08/03/23 (!) 109  07/04/23 89    Objective:    BP 127/74   Pulse (!) 106   Ht 5\' 2"  (1.575 m)   Wt 191 lb (86.6 kg)   LMP 04/14/2023 (Exact Date)   BMI 34.93 kg/m   Assessment:   1. BP check   2. Nonintractable headache, unspecified chronicity pattern, unspecified headache type      Plan:   Per Dr. Autumn Messing, CNM:  Continue current treatment regimen.  Patient verbalized understanding of instructions.   Cornelius Moras, CMA

## 2023-08-18 DIAGNOSIS — Z419 Encounter for procedure for purposes other than remedying health state, unspecified: Secondary | ICD-10-CM | POA: Diagnosis not present

## 2023-08-28 ENCOUNTER — Ambulatory Visit (INDEPENDENT_AMBULATORY_CARE_PROVIDER_SITE_OTHER): Payer: Medicaid Other | Admitting: Obstetrics

## 2023-08-28 ENCOUNTER — Ambulatory Visit: Payer: Medicaid Other

## 2023-08-28 ENCOUNTER — Encounter: Payer: Self-pay | Admitting: Obstetrics

## 2023-08-28 VITALS — BP 111/67 | HR 81 | Wt 192.0 lb

## 2023-08-28 DIAGNOSIS — O10919 Unspecified pre-existing hypertension complicating pregnancy, unspecified trimester: Secondary | ICD-10-CM

## 2023-08-28 DIAGNOSIS — O0990 Supervision of high risk pregnancy, unspecified, unspecified trimester: Secondary | ICD-10-CM

## 2023-08-28 DIAGNOSIS — E669 Obesity, unspecified: Secondary | ICD-10-CM

## 2023-08-28 DIAGNOSIS — O99212 Obesity complicating pregnancy, second trimester: Secondary | ICD-10-CM

## 2023-08-28 DIAGNOSIS — O10012 Pre-existing essential hypertension complicating pregnancy, second trimester: Secondary | ICD-10-CM

## 2023-08-28 DIAGNOSIS — O0992 Supervision of high risk pregnancy, unspecified, second trimester: Secondary | ICD-10-CM

## 2023-08-28 DIAGNOSIS — D573 Sickle-cell trait: Secondary | ICD-10-CM | POA: Diagnosis not present

## 2023-08-28 DIAGNOSIS — Z3A19 19 weeks gestation of pregnancy: Secondary | ICD-10-CM

## 2023-08-28 DIAGNOSIS — O99013 Anemia complicating pregnancy, third trimester: Secondary | ICD-10-CM | POA: Diagnosis not present

## 2023-08-28 DIAGNOSIS — Z362 Encounter for other antenatal screening follow-up: Secondary | ICD-10-CM

## 2023-08-28 DIAGNOSIS — Z3689 Encounter for other specified antenatal screening: Secondary | ICD-10-CM

## 2023-08-28 NOTE — Progress Notes (Signed)
    Return Prenatal Note   Subjective  33 y.o. [id number] at [redacted]w[redacted]d presents for this follow-up prenatal visit. Pregnancy notable for cHTN on Procardia, BMI, and sickle cell trait.   Patient is well today, here with her partner and daughter. Anatomy US done earlier in clinic today, pt was told she had too much fluid and has questions about that. Denies hx of T2DM. Early 1hGTT 94.   Patient reports: Movement: Present Contractions: Not present Denies vaginal bleeding or leaking fluid. Objective  Flow sheet Vitals: Pulse Rate: 81 BP: 111/67 Fundal Height: 20 cm Fetal Heart Rate (bpm): 149 Total weight gain: 16 lb 6.4 oz (7.439 kg)  General Appearance  No acute distress, well appearing, and well nourished Pulmonary   Normal work of breathing Neurologic   Alert and oriented to person, place, and time Psychiatric   Mood and affect within normal limits  Assessment/Plan   Plan  33 y.o. [id number] at [redacted]w[redacted]d by LMP=6wk [place] presents for follow-up OB visit. Reviewed prenatal record including previous visit note. 1. Supervision of high risk pregnancy in second trimester (Primary) -Reviewed round ligament pain and 2nd trimester changes -Prelim anatomy US today without anomalies, MVP >8. Repeat in 4wks, ordered.   2. Pre-existing hypertension affecting pregnancy, antepartum -BP 111/67 today, baseline labs normal.  -Continue Procardia and daily ASA -Preeclampsia precautions given  3. Obesity affecting pregnancy in second trimester, unspecified obesity type -Limit weight gain to 11-20 lbs for total pregnancy -Early 1h GTT 94, repeat at 28wks   4. Sickle cell trait -Offer partner testing if not already done   Orders Placed This Encounter  Procedures   US OB Follow Up    Standing Status:   Future    Expected Date:   09/25/2023    Expiration Date:   08/27/2024    Reason for exam::   F/u incomplete anatomy    Preferred imaging location?:   Internal   Return in about 4 weeks (around  09/25/2023) for ROB w/follow up US.   Future Appointments  Date Time Provider Department Center  09/25/2023 10:15 AM [facility] [place] 1 [facility] None  09/25/2023 11:15 AM Swanson, Adan Sis, CNM [facility] None    For next visit:  continue with routine prenatal care    Julieanne Manson, DO [facility]

## 2023-08-28 NOTE — Patient Instructions (Signed)
Second Trimester of Pregnancy  The second trimester of pregnancy is from week 14 through week 27. This is months 4 through 6 of pregnancy. During the second trimester: Morning sickness is less or has stopped. You may have more energy. You may feel hungry more often. At this time, your unborn baby is growing very fast. At the end of the sixth month, the unborn baby may be up to 12 inches long and weigh about 1 pounds. You will likely start to feel the baby move between 16 and 20 weeks of pregnancy. Body changes during your second trimester Your body continues to change during this time. The changes usually go away after your baby is born. Physical changes You will gain more weight. Your belly will get bigger. You may begin to get stretch marks on your hips, belly, and breasts. Your breasts will keep growing and may hurt. You may get dark spots or blotches on your face. A dark line from your belly button to the pubic area may appear. This line is called linea nigra. Your hair may grow faster and get thicker. Health changes You may have headaches. You may have heartburn. You may pee more often. You may have swollen, bulging veins (varicose veins). You may have trouble pooping (constipation), or swollen veins in the butt that can itch or get painful (hemorrhoids). You may have back pain. This is caused by: Weight gain. Pregnancy hormones that are relaxing the joints in your pelvis. Follow these instructions at home: Medicines Talk to your health care provider if you're taking medicines. Ask if the medicines are safe to take during pregnancy. Your provider may change the medicines that you take. Do not take any medicines unless told to by your provider. Take a prenatal vitamin that has at least 600 micrograms (mcg) of folic acid. Do not use herbal medicines, illegal drugs, or medicines that are not approved by your provider. Eating and drinking While you're pregnant your body needs  extra food for your growing baby. Talk with your provider about what to eat while pregnant. Activity Most women are able to exercise during pregnancy. Exercises may need to change as your pregnancy goes on. Talk to your provider about your activities and exercise routines. Relieving pain and discomfort Wear a good, supportive bra if your breasts hurt. Rest with your legs raised if you have leg cramps or low back pain. Take warm sitz baths to soothe pain from hemorrhoids. Use hemorrhoid cream if your provider says it's okay. Do not douche. Do not use tampons or scented pads. Do not use hot tubs, steam rooms, or saunas. Safety Wear your seatbelt at all times when you're in a car. Talk to your provider if someone hits you, hurts you, or yells at you. Talk with your provider if you're feeling sad or have thoughts of hurting yourself. Lifestyle Certain things can be harmful while you're pregnant. It's best to avoid the following: Do not drink alcohol,smoke, vape, or use products with nicotine or tobacco in them. If you need help quitting, talk with your provider. Avoid cat litter boxes and soil used by cats. These things carry germs that can cause harm to your pregnancy and your baby. General instructions Keep all follow-up visits. It helps you and your unborn baby stay as healthy as possible. Write down your questions. Take them to your prenatal visits. Your provider will: Talk with you about your overall health. Give you advice or refer you to specialists who can help with different needs,  including: Prenatal education classes. Mental health and counseling. Foods and healthy eating. Ask for help if you need help with food. Where to find more information American Pregnancy Association: americanpregnancy.org [employer] of Obstetricians and Gynecologists: acog.org Office on [employer] Health: [employer]Less[employer].ca Contact a health care provider if: You have a headache that does not go away  when you take medicine. You have any of these problems: You can't eat or drink. You throw up or feel like you may throw up. You have watery poop (diarrhea) for 2 days or more. You have pain when you pee or your pee smells bad. You have been sick for 2 days or more and are not getting better. Contact your provider right away if: You have any of these coming from your vagina: Abnormal discharge. Bad-smelling fluid. Bleeding. Your baby is moving less than usual. You have contractions, belly cramping, or have pain in your pelvis or lower back. You have symptoms of high blood pressure or preeclampsia. These include: A severe, throbbing headache that does not go away. Sudden or extreme swelling of your face, hands, legs, or feet. Vision problems: You see spots. You have blurry vision. Your eyes are sensitive to light. If you can't reach the provider, go to an urgent care or emergency room. Get help right away if: You faint, become confused, or can't think clearly. You have chest pain or trouble breathing. You have any kind of injury, such as from a fall or a car crash. These symptoms may be an emergency. Call 911 right away. Do not wait to see if the symptoms will go away. Do not drive yourself to the hospital. This information is not intended to replace advice given to you by your health care provider. Make sure you discuss any questions you have with your health care provider. Document Revised: 04/05/2023 Document Reviewed: 11/03/2022 Elsevier Patient Education  2024 [employer].

## 2023-09-03 ENCOUNTER — Encounter: Payer: Self-pay | Admitting: Obstetrics and Gynecology

## 2023-09-15 DIAGNOSIS — Z419 Encounter for procedure for purposes other than remedying health state, unspecified: Secondary | ICD-10-CM | POA: Diagnosis not present

## 2023-09-25 ENCOUNTER — Ambulatory Visit (INDEPENDENT_AMBULATORY_CARE_PROVIDER_SITE_OTHER): Payer: Medicaid Other | Admitting: Obstetrics

## 2023-09-25 ENCOUNTER — Ambulatory Visit: Payer: Medicaid Other

## 2023-09-25 VITALS — BP 126/77 | HR 86 | Wt 194.5 lb

## 2023-09-25 DIAGNOSIS — O0990 Supervision of high risk pregnancy, unspecified, unspecified trimester: Secondary | ICD-10-CM

## 2023-09-25 DIAGNOSIS — O10919 Unspecified pre-existing hypertension complicating pregnancy, unspecified trimester: Secondary | ICD-10-CM

## 2023-09-25 DIAGNOSIS — O10012 Pre-existing essential hypertension complicating pregnancy, second trimester: Secondary | ICD-10-CM | POA: Diagnosis not present

## 2023-09-25 DIAGNOSIS — E669 Obesity, unspecified: Secondary | ICD-10-CM

## 2023-09-25 DIAGNOSIS — Z362 Encounter for other antenatal screening follow-up: Secondary | ICD-10-CM

## 2023-09-25 DIAGNOSIS — O99212 Obesity complicating pregnancy, second trimester: Secondary | ICD-10-CM

## 2023-09-25 DIAGNOSIS — Z3A23 23 weeks gestation of pregnancy: Secondary | ICD-10-CM | POA: Diagnosis not present

## 2023-09-25 NOTE — Assessment & Plan Note (Signed)
-  Taking Procardia XL 30 mg daily. Normotensive today -Referral to MFM for f/u US and consultation

## 2023-09-25 NOTE — Assessment & Plan Note (Addendum)
-  Anatomy US still incomplete. Refer to MFM -Reviewed s/s of PTL and when to go to the hospital -Discussed 28-week labs at next visit -Anticipatory guidance about late second/early third trimester and fetal development -Encouraged frequent, calorie-dense snacks

## 2023-09-25 NOTE — Progress Notes (Signed)
    Return Prenatal Note   Assessment/Plan   Plan  33 y.o. [id number] at [redacted]w[redacted]d presents for follow-up OB visit. Reviewed prenatal record including previous visit note.  Supervision of high risk pregnancy, antepartum -Anatomy US still incomplete. Refer to MFM -Reviewed s/s of PTL and when to go to the hospital -Discussed 28-week labs at next visit -Anticipatory guidance about late second/early third trimester and fetal development -Encouraged frequent, calorie-dense snacks    Pre-existing hypertension affecting pregnancy, antepartum -Taking Procardia XL 30 mg daily. Normotensive today -Referral to MFM for f/u US and consultation    Orders Placed This Encounter  Procedures   [place] MFM OB DETAIL +14 WK    Standing Status:   Future    Expected Date:   10/26/2023    Expiration Date:   09/24/2024    Reason for Exam (SYMPTOM  OR DIAGNOSIS REQUIRED):   CHTN on meds, incomplete anatomy    Preferred Location:   [facility]   Return in about 4 weeks (around 10/23/2023).   Future Appointments  Date Time Provider Department Center  10/23/2023  8:20 AM [facility] None  10/23/2023  8:35 AM Julieanne Manson, MD [facility] None    For next visit:  ROB with 28-week labs and TDaP    Subjective   Alexis Elliott had her f/u anatomy scan today but there are still incomplete views. She has noticed that her appetite has decreased recently.   Movement: Present Contractions: Irritability  Objective   Flow sheet Vitals: Pulse Rate: 86 BP: 126/77 Fundal Height: 23 cm Fetal Heart Rate (bpm): 142 Total weight gain: 18 lb 14.4 oz (8.573 kg)  General Appearance  No acute distress, well appearing, and well nourished Pulmonary   Normal work of breathing Neurologic   Alert and oriented to person, place, and time Psychiatric   Mood and affect within normal limits  Guadlupe Spanish, CNM 09/25/23 11:53 AM

## 2023-09-27 ENCOUNTER — Emergency Department
Admission: EM | Admit: 2023-09-27 | Discharge: 2023-09-27 | Disposition: A | Discharge status: Home / Self Care | Attending: Emergency Medicine | Admitting: Emergency Medicine

## 2023-09-27 ENCOUNTER — Other Ambulatory Visit: Payer: Self-pay

## 2023-09-27 DIAGNOSIS — L02411 Cutaneous abscess of right axilla: Secondary | ICD-10-CM | POA: Insufficient documentation

## 2023-09-27 DIAGNOSIS — O99719 Diseases of the skin and subcutaneous tissue complicating pregnancy, unspecified trimester: Secondary | ICD-10-CM | POA: Diagnosis not present

## 2023-09-27 HISTORY — DX: Essential (primary) hypertension: I10

## 2023-09-27 MED ORDER — CEPHALEXIN 500 MG PO CAPS: 500.0000 mg | ORAL_CAPSULE | Freq: Three times a day (TID) | ORAL | 0 refills | Status: AC

## 2023-09-27 NOTE — ED Provider Notes (Signed)
 [facility] Provider Note    Event Date/Time   First MD Initiated Contact with Patient 09/27/23 0845     (approximate)   History   Abscess   HPI  Alexis Elliott is a 33 y.o. female who is currently pregnant presents emergency department with abscess to the right axilla.  States it opened up earlier today has been draining green and brown discharge.  No fever or chills.  Changed deodorants about a month ago.  Uses an Neurosurgeon      Physical Exam   Triage Vital Signs: ED Triage Vitals  Encounter Vitals Group     BP 09/27/23 0841 125/78     Systolic BP Percentile --      Diastolic BP Percentile --      Pulse Rate 09/27/23 0839 95     Resp 09/27/23 0839 20     Temp 09/27/23 0839 98.3 F (36.8 C)     Temp Source 09/27/23 0839 Oral     SpO2 09/27/23 0839 99 %     Weight 09/27/23 0839 194 lb (88 kg)     Height 09/27/23 0839 5\' 2"  (1.575 m)     Head Circumference --      Peak Flow --      Pain Score 09/27/23 0839 7     Pain Loc --      Pain Education --      Exclude from Growth Chart --     Most recent vital signs: Vitals:   09/27/23 0839 09/27/23 0841  BP:  125/78  Pulse: 95   Resp: 20   Temp: 98.3 F (36.8 C)   SpO2: 99%      General: Awake, no distress.   CV:  Good peripheral perfusion. regular rate and  rhythm Resp:  Normal effort.  Abd:  No distention.   Other:  Skin with noted draining abscess in the right axilla, pressure applied to the area and further pus was expelled only serous fluid.   ED Results / Procedures / Treatments   Labs (all labs ordered are listed, but only abnormal results are displayed) Labs Reviewed - No data to display   EKG     RADIOLOGY     PROCEDURES:   Procedures Chief Complaint  Patient presents with   Abscess      MEDICATIONS ORDERED IN ED: Medications - No data to display   IMPRESSION / MDM / ASSESSMENT AND PLAN / ED COURSE  I reviewed the triage vital signs and the  nursing notes.                              Differential diagnosis includes, but is not limited to, abscess, cellulitis, hidradenitis  Patient's presentation is most consistent with acute illness / injury with system symptoms.   Patient's exam is consistent with abscess.  Will go ahead and place her on antibiotic.  Place her on Keflex as she is pregnant.  She is to follow-up with her regular doctor if not improving 3 days.  Return if worsening.  Warm compresses are to be applied.  Patient is in agreement treatment plan.  Discharged stable condition.      FINAL CLINICAL IMPRESSION(S) / ED DIAGNOSES   Final diagnoses:  Abscess of axilla, right     Rx / DC Orders   ED Discharge Orders          Ordered  cephALEXin (KEFLEX) 500 MG capsule  3 times daily        09/27/23 0857             Note:  This document was prepared using Dragon voice recognition software and may include unintentional dictation errors.    Faythe Ghee, PA-C 09/27/23 1610    Merwyn Katos, MD 09/27/23 [phone]

## 2023-09-27 NOTE — ED Triage Notes (Signed)
 Patient states she has an abscess under right axilla, reports it opened this morning and is draining green/brown discharge.

## 2023-09-27 NOTE — Discharge Instructions (Signed)
 Apply warm compress to the area, stand in the shower with your arm raised to allow the warm water to run over this area.  At this time it does not need to be lanced as it is already draining.  However if it is worsening instead of improving please return emergency department

## 2023-10-22 ENCOUNTER — Telehealth

## 2023-10-23 ENCOUNTER — Other Ambulatory Visit

## 2023-10-23 ENCOUNTER — Encounter: Admitting: Obstetrics

## 2023-10-23 DIAGNOSIS — Z130 Encounter for screening for diseases of the blood and blood-forming organs and certain disorders involving the immune mechanism: Secondary | ICD-10-CM

## 2023-10-23 DIAGNOSIS — O0990 Supervision of high risk pregnancy, unspecified, unspecified trimester: Secondary | ICD-10-CM

## 2023-10-23 DIAGNOSIS — Z113 Encounter for screening for infections with a predominantly sexual mode of transmission: Secondary | ICD-10-CM

## 2023-10-23 DIAGNOSIS — Z131 Encounter for screening for diabetes mellitus: Secondary | ICD-10-CM

## 2023-10-23 DIAGNOSIS — Z3A26 26 weeks gestation of pregnancy: Secondary | ICD-10-CM

## 2023-10-24 ENCOUNTER — Other Ambulatory Visit: Payer: Self-pay

## 2023-10-24 ENCOUNTER — Ambulatory Visit: Attending: Maternal & Fetal Medicine

## 2023-10-24 VITALS — BP 133/82 | HR 113

## 2023-10-24 DIAGNOSIS — O99212 Obesity complicating pregnancy, second trimester: Secondary | ICD-10-CM

## 2023-10-24 DIAGNOSIS — Z362 Encounter for other antenatal screening follow-up: Secondary | ICD-10-CM | POA: Diagnosis not present

## 2023-10-24 DIAGNOSIS — Z363 Encounter for antenatal screening for malformations: Secondary | ICD-10-CM | POA: Insufficient documentation

## 2023-10-24 DIAGNOSIS — O10912 Unspecified pre-existing hypertension complicating pregnancy, second trimester: Secondary | ICD-10-CM | POA: Diagnosis not present

## 2023-10-24 DIAGNOSIS — Z3A27 27 weeks gestation of pregnancy: Secondary | ICD-10-CM | POA: Insufficient documentation

## 2023-10-24 DIAGNOSIS — O10012 Pre-existing essential hypertension complicating pregnancy, second trimester: Secondary | ICD-10-CM

## 2023-10-24 DIAGNOSIS — E669 Obesity, unspecified: Secondary | ICD-10-CM

## 2023-10-24 DIAGNOSIS — O10919 Unspecified pre-existing hypertension complicating pregnancy, unspecified trimester: Secondary | ICD-10-CM

## 2023-10-24 DIAGNOSIS — Z79899 Other long term (current) drug therapy: Secondary | ICD-10-CM | POA: Insufficient documentation

## 2023-10-24 DIAGNOSIS — O0990 Supervision of high risk pregnancy, unspecified, unspecified trimester: Secondary | ICD-10-CM

## 2023-10-24 DIAGNOSIS — O321XX0 Maternal care for breech presentation, not applicable or unspecified: Secondary | ICD-10-CM | POA: Insufficient documentation

## 2023-10-25 ENCOUNTER — Ambulatory Visit (INDEPENDENT_AMBULATORY_CARE_PROVIDER_SITE_OTHER): Admitting: Obstetrics

## 2023-10-25 ENCOUNTER — Encounter: Payer: Self-pay | Admitting: Obstetrics

## 2023-10-25 ENCOUNTER — Other Ambulatory Visit

## 2023-10-25 VITALS — BP 116/81 | HR 97 | Wt 191.0 lb

## 2023-10-25 DIAGNOSIS — O10012 Pre-existing essential hypertension complicating pregnancy, second trimester: Secondary | ICD-10-CM

## 2023-10-25 DIAGNOSIS — D649 Anemia, unspecified: Secondary | ICD-10-CM

## 2023-10-25 DIAGNOSIS — O10919 Unspecified pre-existing hypertension complicating pregnancy, unspecified trimester: Secondary | ICD-10-CM

## 2023-10-25 DIAGNOSIS — O99212 Obesity complicating pregnancy, second trimester: Secondary | ICD-10-CM

## 2023-10-25 DIAGNOSIS — Z113 Encounter for screening for infections with a predominantly sexual mode of transmission: Secondary | ICD-10-CM

## 2023-10-25 DIAGNOSIS — Z23 Encounter for immunization: Secondary | ICD-10-CM

## 2023-10-25 DIAGNOSIS — Z131 Encounter for screening for diabetes mellitus: Secondary | ICD-10-CM

## 2023-10-25 DIAGNOSIS — Z3A27 27 weeks gestation of pregnancy: Secondary | ICD-10-CM | POA: Diagnosis not present

## 2023-10-25 DIAGNOSIS — O0990 Supervision of high risk pregnancy, unspecified, unspecified trimester: Secondary | ICD-10-CM

## 2023-10-25 MED ORDER — ONDANSETRON 4 MG PO TBDP: 4.0000 mg | ORAL_TABLET | Freq: Three times a day (TID) | ORAL | 1 refills | Status: DC | PRN

## 2023-10-25 NOTE — Progress Notes (Addendum)
    Return Prenatal Note   Assessment/Plan   Plan  33 y.o. [id number] at [redacted]w[redacted]d presents for follow-up OB visit. Reviewed prenatal record including previous visit note.  Pre-existing hypertension affecting pregnancy, antepartum -MFM f/u scheduled 12/03/23 -NSTs to start at 32 weeks -Normotensive today   Supervision of high risk pregnancy, antepartum - Anticipatory guidance reviewed  - Danger signs reviewed and when to seek care.  - Zofran ordered for nausea, discussed eating a small protein packed meal before laying down after third shift to help with nausea and vomiting upon awaking. Encouraged cessation of marijuana. - Reviewed birth plan and handout given.  - Desires depo after delivery  - Belly band for support to help pelvic pressure and round ligament pain.     Orders Placed This Encounter  Procedures   Tdap vaccine greater than or equal to 7yo IM   28 Week RH+Panel   Return in about 2 weeks (around 11/08/2023).   Future Appointments  Date Time Provider Department Center  11/09/2023  1:15 PM Slaughterbeck, Irving Burton, CNM [facility] None  12/03/2023 11:00 AM [facility]    For next visit:  Routine prenatal care    Subjective  Megin has been feeling ok, she has had trouble eating and keeping food down. She has smoked marijuana to try and build up an appetite. She has also been having worsening pelvic pain and pressure.   Movement: Present Contractions: Not present  Objective   Flow sheet Vitals: Pulse Rate: 97 BP: 116/81 Fundal Height: 28 cm Fetal Heart Rate (bpm): 137 Total weight gain: 15 lb 6.4 oz (6.985 kg)  General Appearance  No acute distress, well appearing, and well nourished Pulmonary   Normal work of breathing Neurologic   Alert and oriented to person, place, and time Psychiatric   Mood and affect within normal limits  Ulice Dash, [facility] 10/25/23 1:55 PM

## 2023-10-25 NOTE — Assessment & Plan Note (Addendum)
-   Anticipatory guidance reviewed  - Danger signs reviewed and when to seek care.  - Zofran ordered for nausea, discussed eating a small protein packed meal before laying down after third shift to help with nausea and vomiting upon awaking. Encouraged cessation of marijuana. - Reviewed birth plan and handout given.  - Desires depo after delivery  - Belly band for support to help pelvic pressure and round ligament pain.

## 2023-10-25 NOTE — Assessment & Plan Note (Addendum)
-  MFM f/u scheduled 12/03/23 -NSTs to start at 32 weeks -Normotensive today

## 2023-10-27 DIAGNOSIS — Z419 Encounter for procedure for purposes other than remedying health state, unspecified: Secondary | ICD-10-CM | POA: Diagnosis not present

## 2023-10-30 ENCOUNTER — Encounter: Payer: Self-pay | Admitting: Obstetrics

## 2023-10-30 LAB — 28 WEEK RH+PANEL
Basophils Absolute: 0 10*3/uL (ref 0.0–0.2)
Basos: 0 %
EOS (ABSOLUTE): 0.1 10*3/uL (ref 0.0–0.4)
Eos: 1 %
Gestational Diabetes Screen: 127 mg/dL (ref 70–139)
HIV Screen 4th Generation wRfx: NONREACTIVE
Hematocrit: 36.1 % (ref 34.0–46.6)
Hemoglobin: 11.5 g/dL (ref 11.1–15.9)
Immature Grans (Abs): 0.2 10*3/uL — ABNORMAL HIGH (ref 0.0–0.1)
Immature Granulocytes: 1 %
Lymphocytes Absolute: 2.4 10*3/uL (ref 0.7–3.1)
Lymphs: 22 %
MCH: 26.6 pg (ref 26.6–33.0)
MCHC: 31.9 g/dL (ref 31.5–35.7)
MCV: 84 fL (ref 79–97)
Monocytes Absolute: 0.8 10*3/uL (ref 0.1–0.9)
Monocytes: 7 %
Neutrophils Absolute: 7.6 10*3/uL — ABNORMAL HIGH (ref 1.4–7.0)
Neutrophils: 69 %
Platelets: 345 10*3/uL (ref 150–450)
RBC: 4.32 x10E6/uL (ref 3.77–5.28)
RDW: 12.9 % (ref 11.7–15.4)
RPR Ser Ql: NONREACTIVE
WBC: 11.1 10*3/uL — ABNORMAL HIGH (ref 3.4–10.8)

## 2023-11-09 ENCOUNTER — Encounter: Admitting: Certified Nurse Midwife

## 2023-11-09 ENCOUNTER — Telehealth: Payer: Self-pay | Admitting: Certified Nurse Midwife

## 2023-11-09 NOTE — Telephone Encounter (Signed)
 Reached out to pt to reschedule ROB appt that was scheduled on 11/09/2023 at 1:15 with E. Slaughterbeck.  Could not leave a message bc phone never went to voicemail.

## 2023-11-12 NOTE — Telephone Encounter (Signed)
 Reached out to pt (2x) to reschedule ROB appt that was scheduled on 11/09/2023 at 1:15 with E. Slaughterbeck.  Was able to reschedule appt to 11/13/2023 at 1:15 with Cinda Craze.

## 2023-11-13 ENCOUNTER — Ambulatory Visit (INDEPENDENT_AMBULATORY_CARE_PROVIDER_SITE_OTHER): Admitting: Obstetrics

## 2023-11-13 VITALS — BP 130/80 | HR 103 | Wt 194.0 lb

## 2023-11-13 DIAGNOSIS — O0990 Supervision of high risk pregnancy, unspecified, unspecified trimester: Secondary | ICD-10-CM

## 2023-11-13 DIAGNOSIS — O10013 Pre-existing essential hypertension complicating pregnancy, third trimester: Secondary | ICD-10-CM | POA: Diagnosis not present

## 2023-11-13 DIAGNOSIS — Z3A30 30 weeks gestation of pregnancy: Secondary | ICD-10-CM | POA: Diagnosis not present

## 2023-11-13 DIAGNOSIS — O10919 Unspecified pre-existing hypertension complicating pregnancy, unspecified trimester: Secondary | ICD-10-CM | POA: Diagnosis not present

## 2023-11-13 NOTE — Assessment & Plan Note (Addendum)
-  Preeclampsia labs today. Will continue Procardia  at current dose. Discussed potential need to increase as pregnancy progresses if BPs >140/90. -Will start weekly NSTs at 32w -Growth US  12/03/23

## 2023-11-13 NOTE — Assessment & Plan Note (Signed)
 Reviewed kick counts and preterm labor warning signs. Instructed to call office or come to hospital with persistent headache, vision changes, regular contractions, leaking of fluid, decreased fetal movement or vaginal bleeding.

## 2023-11-13 NOTE — Progress Notes (Signed)
    Return Prenatal Note   Assessment/Plan   Plan  33 y.o. [id number] at [redacted]w[redacted]d presents for follow-up OB visit. Reviewed prenatal record including previous visit note.  Pre-existing hypertension affecting pregnancy, antepartum -Preeclampsia labs today. Will continue Procardia  at current dose. Discussed potential need to increase as pregnancy progresses if BPs >140/90. -Will start weekly NSTs at 32w -Growth US  12/03/23   Supervision of high risk pregnancy, antepartum -Reviewed kick counts and preterm labor warning signs. Instructed to call office or come to hospital with persistent headache, vision changes, regular contractions, leaking of fluid, decreased fetal movement or vaginal bleeding.     Orders Placed This Encounter  Procedures   CBC   Comp Met (CMET)   Protein / creatinine ratio, urine   Return in about 2 weeks (around 11/27/2023).   Future Appointments  Date Time Provider Department Center  11/27/2023 10:15 AM AOB-NST ROOM [facility] None  11/27/2023 10:55 AM Phylliss Brenner, CNM [facility] None  12/03/2023 11:00 AM [facility]    For next visit:  ROB with NST    Subjective   Alexis Elliott has been noticing more swelling in her feet and sometimes in her hands. Last week, she had a headache that lasted the entire day. She had to lie in a cold, dark room. She denies RUQ pain and visual changes.   Movement: Present Contractions: Irritability  Objective   Flow sheet Vitals: Pulse Rate: (!) 103 BP: 130/80 Fundal Height: 28 cm Fetal Heart Rate (bpm): 158 Total weight gain: 18 lb 6.4 oz (8.346 kg)  General Appearance  No acute distress, well appearing, and well nourished Pulmonary   Normal work of breathing Neurologic   Alert and oriented to person, place, and time Psychiatric   Mood and affect within normal limits  Josue Nip, CNM 11/13/23 2:36 PM

## 2023-11-14 ENCOUNTER — Encounter: Payer: Self-pay | Admitting: Obstetrics

## 2023-11-14 LAB — COMPREHENSIVE METABOLIC PANEL WITH GFR
ALT: 24 IU/L (ref 0–32)
AST: 18 IU/L (ref 0–40)
Albumin: 3.5 g/dL — ABNORMAL LOW (ref 3.9–4.9)
Alkaline Phosphatase: 145 IU/L — ABNORMAL HIGH (ref 44–121)
BUN/Creatinine Ratio: 9 (ref 9–23)
BUN: 7 mg/dL (ref 6–20)
Bilirubin Total: <0.2 mg/dL (ref 0.0–1.2)
CO2: 18 mmol/L — ABNORMAL LOW (ref 20–29)
Calcium: 9.5 mg/dL (ref 8.7–10.2)
Chloride: 102 mmol/L (ref 96–106)
Creatinine, Ser: 0.81 mg/dL (ref 0.57–1.00)
Globulin, Total: 3 g/dL (ref 1.5–4.5)
Glucose: 100 mg/dL — ABNORMAL HIGH (ref 70–99)
Potassium: 4.2 mmol/L (ref 3.5–5.2)
Sodium: 135 mmol/L (ref 134–144)
Total Protein: 6.5 g/dL (ref 6.0–8.5)
eGFR: 98 mL/min/{1.73_m2} (ref >59)

## 2023-11-14 LAB — CBC
Hematocrit: 36.7 % (ref 34.0–46.6)
Hemoglobin: 11.5 g/dL (ref 11.1–15.9)
MCH: 26 pg — ABNORMAL LOW (ref 26.6–33.0)
MCHC: 31.3 g/dL — ABNORMAL LOW (ref 31.5–35.7)
MCV: 83 fL (ref 79–97)
Platelets: 345 10*3/uL (ref 150–450)
RBC: 4.42 x10E6/uL (ref 3.77–5.28)
RDW: 12.7 % (ref 11.7–15.4)
WBC: 11.4 10*3/uL — ABNORMAL HIGH (ref 3.4–10.8)

## 2023-11-14 LAB — PROTEIN / CREATININE RATIO, URINE
Creatinine, Urine: 165.7 mg/dL
Protein, Ur: 21.4 mg/dL
Protein/Creat Ratio: 129 mg/g{creat} (ref 0–200)

## 2023-11-15 ENCOUNTER — Encounter: Payer: Self-pay | Admitting: Certified Nurse Midwife

## 2023-11-15 ENCOUNTER — Observation Stay
Admission: EM | Admit: 2023-11-15 | Discharge: 2023-11-15 | Disposition: A | Discharge status: Home / Self Care | Attending: Obstetrics and Gynecology | Admitting: Certified Nurse Midwife

## 2023-11-15 ENCOUNTER — Encounter: Payer: Self-pay | Admitting: Obstetrics and Gynecology

## 2023-11-15 ENCOUNTER — Other Ambulatory Visit: Payer: Self-pay

## 2023-11-15 ENCOUNTER — Observation Stay

## 2023-11-15 DIAGNOSIS — O26899 Other specified pregnancy related conditions, unspecified trimester: Secondary | ICD-10-CM | POA: Diagnosis present

## 2023-11-15 DIAGNOSIS — O26893 Other specified pregnancy related conditions, third trimester: Principal | ICD-10-CM | POA: Insufficient documentation

## 2023-11-15 DIAGNOSIS — O0990 Supervision of high risk pregnancy, unspecified, unspecified trimester: Principal | ICD-10-CM

## 2023-11-15 DIAGNOSIS — R1030 Lower abdominal pain, unspecified: Secondary | ICD-10-CM | POA: Insufficient documentation

## 2023-11-15 DIAGNOSIS — N390 Urinary tract infection, site not specified: Secondary | ICD-10-CM | POA: Insufficient documentation

## 2023-11-15 DIAGNOSIS — R109 Unspecified abdominal pain: Secondary | ICD-10-CM

## 2023-11-15 DIAGNOSIS — Z3A30 30 weeks gestation of pregnancy: Secondary | ICD-10-CM | POA: Diagnosis not present

## 2023-11-15 DIAGNOSIS — O2343 Unspecified infection of urinary tract in pregnancy, third trimester: Secondary | ICD-10-CM | POA: Diagnosis not present

## 2023-11-15 LAB — URINALYSIS, ROUTINE W REFLEX MICROSCOPIC
Bilirubin Urine: NEGATIVE
Glucose, UA: NEGATIVE mg/dL
Hgb urine dipstick: NEGATIVE
Ketones, ur: NEGATIVE mg/dL
Nitrite: NEGATIVE
Protein, ur: 30 mg/dL — AB
Specific Gravity, Urine: 1.028 (ref 1.005–1.030)
pH: 6 (ref 5.0–8.0)

## 2023-11-15 LAB — COMPREHENSIVE METABOLIC PANEL WITH GFR
ALT: 24 U/L (ref 0–44)
AST: 23 U/L (ref 15–41)
Albumin: 3 g/dL — ABNORMAL LOW (ref 3.5–5.0)
Alkaline Phosphatase: 117 U/L (ref 38–126)
Anion gap: 5 (ref 5–15)
BUN: 9 mg/dL (ref 6–20)
CO2: 20 mmol/L — ABNORMAL LOW (ref 22–32)
Calcium: 8.7 mg/dL — ABNORMAL LOW (ref 8.9–10.3)
Chloride: 108 mmol/L (ref 98–111)
Creatinine, Ser: 0.77 mg/dL (ref 0.44–1.00)
GFR, Estimated: >60 mL/min (ref >60)
Glucose, Bld: 68 mg/dL — ABNORMAL LOW (ref 70–99)
Potassium: 3.8 mmol/L (ref 3.5–5.1)
Sodium: 133 mmol/L — ABNORMAL LOW (ref 135–145)
Total Bilirubin: 0.6 mg/dL (ref 0.0–1.2)
Total Protein: 6.8 g/dL (ref 6.5–8.1)

## 2023-11-15 LAB — CBC
HCT: 36.1 % (ref 36.0–46.0)
Hemoglobin: 11.5 g/dL — ABNORMAL LOW (ref 12.0–15.0)
MCH: 26.6 pg (ref 26.0–34.0)
MCHC: 31.9 g/dL (ref 30.0–36.0)
MCV: 83.6 fL (ref 80.0–100.0)
Platelets: 319 10*3/uL (ref 150–400)
RBC: 4.32 MIL/uL (ref 3.87–5.11)
RDW: 13.6 % (ref 11.5–15.5)
WBC: 11.8 10*3/uL — ABNORMAL HIGH (ref 4.0–10.5)
nRBC: 0 % (ref 0.0–0.2)

## 2023-11-15 LAB — AMYLASE: Amylase: 109 U/L — ABNORMAL HIGH (ref 28–100)

## 2023-11-15 LAB — LIPASE, BLOOD: Lipase: 23 U/L (ref 11–51)

## 2023-11-15 MED ORDER — LACTATED RINGERS IV SOLN: 500.0000 mL | INTRAVENOUS | Status: DC | PRN

## 2023-11-15 MED ORDER — NITROFURANTOIN MONOHYD MACRO 100 MG PO CAPS
100.0000 mg | ORAL_CAPSULE | Freq: Two times a day (BID) | ORAL | Status: DC
  Administered 2023-11-15: 100 mg via ORAL
  Filled 2023-11-15: qty 1

## 2023-11-15 MED ORDER — NITROFURANTOIN MONOHYD MACRO 100 MG PO CAPS: 100.0000 mg | ORAL_CAPSULE | Freq: Two times a day (BID) | ORAL | 1 refills | Status: DC

## 2023-11-15 MED ORDER — ONDANSETRON HCL 4 MG/2ML IJ SOLN: 4.0000 mg | Freq: Four times a day (QID) | INTRAMUSCULAR | Status: DC | PRN

## 2023-11-15 MED ORDER — SOD CITRATE-CITRIC ACID 500-334 MG/5ML PO SOLN: 30.0000 mL | ORAL | Status: DC | PRN

## 2023-11-15 MED ORDER — ACETAMINOPHEN 325 MG PO TABS
650.0000 mg | ORAL_TABLET | ORAL | Status: DC | PRN
  Administered 2023-11-15: 650 mg via ORAL
  Filled 2023-11-15: qty 2

## 2023-11-15 NOTE — OB Triage Note (Signed)
 Patient arrived with complaints of right sided pain that starts at the top and moves down, constant cramping 7/10. 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  Baby moving well. Denies LOF, Vaginal Bleeding, problems voiding. Pt does state that she has been nauseous for the past few weeks but tolerating food.

## 2023-11-15 NOTE — Discharge Summary (Signed)
 LABOR & DELIVERY OB TRIAGE NOTE  SUBJECTIVE  HPI Alexis Elliott is a 33 y.o. [phone] at [redacted]w[redacted]d who presents to Labor & Delivery for right sided pain that began last night. Pain is described as constant but with increased pain at times that comes in waves. Pain starts to right midback and wraps down side and around to lower abdomen. Some improvement with heat application & tylenol  but pain returns. Endorses fetal movement, denies contractions/cramping, loss of fluid or vaginal bleeding. Denies headache, visual changes or RUQ pain-though pain is to right mid-back. She is taking Nifedipine  as prescribed.  Pain began while she was passing meds while at work, denies injury or any activity that occurred prior to beginning of pain.  OB History     Gravida  6   Para  1   Term  1   Preterm  0   AB  4   Living  1      SAB  0   IAB  3   Ectopic  1   Multiple      Live Births  1           Scheduled Meds:  nitrofurantoin  (macrocrystal-monohydrate)  100 mg Oral Q12H   Continuous Infusions: PRN Meds:.acetaminophen   OBJECTIVE  BP 114/70 (BP Location: Left Arm)   Pulse 89   Temp 98.1 F (36.7 C) (Oral)   Resp 18   LMP 04/14/2023 (Exact Date)   General: A&Ox4, NAD, appears intermittently uncomfortable Heart: regular rate Lungs: normal effort Abdomen: gravid, soft, suprapubic tenderness present extends to right hip, no CVA tenderness Cervical exam:   deferred  NST I reviewed the NST and it was reactive.  Baseline: 140 Variability: moderate Accelerations: present Decelerations: absent Toco: x1, soft resting tone Category I  Results for orders placed or performed during the hospital encounter of 11/15/23 (from the past 24 hours)  Urinalysis, Routine w reflex microscopic -Urine, Clean Catch     Status: Abnormal   Collection Time: 11/15/23 12:30 PM  Result Value Ref Range   Color, Urine YELLOW (A) YELLOW   APPearance HAZY (A) CLEAR   Specific Gravity, Urine 1.028  1.005 - 1.030   pH 6.0 5.0 - 8.0   Glucose, UA NEGATIVE NEGATIVE mg/dL   Hgb urine dipstick NEGATIVE NEGATIVE   Bilirubin Urine NEGATIVE NEGATIVE   Ketones, ur NEGATIVE NEGATIVE mg/dL   Protein, ur 30 (A) NEGATIVE mg/dL   Nitrite NEGATIVE NEGATIVE   Leukocytes,Ua TRACE (A) NEGATIVE   RBC / HPF 0-5 0 - 5 RBC/hpf   WBC, UA 0-5 0 - 5 WBC/hpf   Bacteria, UA RARE (A) NONE SEEN   Squamous Epithelial / HPF 21-50 0 - 5 /HPF   Mucus PRESENT   CBC     Status: Abnormal   Collection Time: 11/15/23 12:48 PM  Result Value Ref Range   WBC 11.8 (H) 4.0 - 10.5 K/uL   RBC 4.32 3.87 - 5.11 MIL/uL   Hemoglobin 11.5 (L) 12.0 - 15.0 g/dL   HCT 14.7 82.9 - 56.2 %   MCV 83.6 80.0 - 100.0 fL   MCH 26.6 26.0 - 34.0 pg   MCHC 31.9 30.0 - 36.0 g/dL   RDW 13.0 86.5 - 78.4 %   Platelets 319 150 - 400 K/uL   nRBC 0.0 0.0 - 0.2 %  Comprehensive metabolic panel     Status: Abnormal   Collection Time: 11/15/23 12:48 PM  Result Value Ref Range   Sodium 133 (L) 135 -  145 mmol/L   Potassium 3.8 3.5 - 5.1 mmol/L   Chloride 108 98 - 111 mmol/L   CO2 20 (L) 22 - 32 mmol/L   Glucose, Bld 68 (L) 70 - 99 mg/dL   BUN 9 6 - 20 mg/dL   Creatinine, Ser 8.65 0.44 - 1.00 mg/dL   Calcium 8.7 (L) 8.9 - 10.3 mg/dL   Total Protein 6.8 6.5 - 8.1 g/dL   Albumin 3.0 (L) 3.5 - 5.0 g/dL   AST 23 15 - 41 U/L   ALT 24 0 - 44 U/L   Alkaline Phosphatase 117 38 - 126 U/L   Total Bilirubin 0.6 0.0 - 1.2 mg/dL   GFR, Estimated >78 >46 mL/min   Anion gap 5 5 - 15  Amylase     Status: Abnormal   Collection Time: 11/15/23 12:48 PM  Result Value Ref Range   Amylase 109 (H) 28 - 100 U/L  Lipase, blood     Status: None   Collection Time: 11/15/23 12:48 PM  Result Value Ref Range   Lipase 23 11 - 51 U/L     ASSESSMENT Impression  1) Pregnancy at [id number], [redacted]w[redacted]d, Estimated Date of Delivery: 01/19/24 2) Reassuring maternal/fetal status 3) Differential diagnosis nephrolithiasis vs muskuloskeletal pain 4) Presumptively treat UTI  given suprapubic tenderness & UA results  PLAN HELLP panel, amylase, lipase collected. Amylase slightly elevated however lipase normal with LFTs normal as well. Renal ultrasound completed, however not yet read. After tylenol  pain has significantly improved. First dose nitrofurantoin  administered. Discussed with Alexis Elliott discharge home now vs awaiting ultrasound results. Reviewed that if stone is found we can consider use of Flomax to assist with passing. With shared decision making Alexis Elliott desired discharge home, will follow up with ultrasound & urine culture results. Home with strainer & hat. If pain increases, signs of infection (fever, chills, nausea/vomiting) present return for evaluation. Maintain next visit as scheduled. Alexis Elliott, CNM 11/15/23  5:16 PM

## 2023-11-15 NOTE — OB Triage Note (Signed)
 Discharge instructions, labor precautions, and follow-up care reviewed with patient and daughter of patient. All questions answered. Patient verbalized understanding. Discharged ambulatory off unit.

## 2023-11-18 LAB — URINE CULTURE: Culture: 30000 — AB

## 2023-11-19 ENCOUNTER — Encounter: Payer: Self-pay | Admitting: Certified Nurse Midwife

## 2023-11-26 DIAGNOSIS — Z419 Encounter for procedure for purposes other than remedying health state, unspecified: Secondary | ICD-10-CM | POA: Diagnosis not present

## 2023-11-27 ENCOUNTER — Encounter: Admitting: Obstetrics

## 2023-11-27 ENCOUNTER — Other Ambulatory Visit

## 2023-11-27 ENCOUNTER — Telehealth: Payer: Self-pay | Admitting: Obstetrics

## 2023-11-27 DIAGNOSIS — O0990 Supervision of high risk pregnancy, unspecified, unspecified trimester: Secondary | ICD-10-CM

## 2023-11-27 DIAGNOSIS — Z3A32 32 weeks gestation of pregnancy: Secondary | ICD-10-CM

## 2023-11-27 DIAGNOSIS — O99212 Obesity complicating pregnancy, second trimester: Secondary | ICD-10-CM

## 2023-11-27 NOTE — Telephone Encounter (Signed)
 Reached out to pt to reschedule appts that were scheduled on 11/27/2023-NST at 10:15 and ROB appt at 10:55 with  Cinda Craze.  Could not leave a message bc call could not be completed at this time.

## 2023-11-28 ENCOUNTER — Encounter: Payer: Self-pay | Admitting: Obstetrics

## 2023-11-28 NOTE — Telephone Encounter (Signed)
 Reached out to pt (2x) to reschedule appts that were scheduled on 11/27/2023-NST at 10:15 and ROB appt at 10:55 with Cinda Craze.  Could not leave a message bc call could not be completed at this time. Will send a MyChart letter to pt.

## 2023-12-03 ENCOUNTER — Ambulatory Visit

## 2023-12-03 DIAGNOSIS — O0990 Supervision of high risk pregnancy, unspecified, unspecified trimester: Secondary | ICD-10-CM

## 2023-12-04 ENCOUNTER — Ambulatory Visit (INDEPENDENT_AMBULATORY_CARE_PROVIDER_SITE_OTHER)

## 2023-12-04 ENCOUNTER — Other Ambulatory Visit

## 2023-12-04 VITALS — BP 112/83 | HR 96 | Wt 194.0 lb

## 2023-12-04 DIAGNOSIS — O10919 Unspecified pre-existing hypertension complicating pregnancy, unspecified trimester: Secondary | ICD-10-CM

## 2023-12-04 DIAGNOSIS — O10013 Pre-existing essential hypertension complicating pregnancy, third trimester: Secondary | ICD-10-CM | POA: Diagnosis not present

## 2023-12-04 DIAGNOSIS — Z3A33 33 weeks gestation of pregnancy: Secondary | ICD-10-CM

## 2023-12-04 DIAGNOSIS — Z3689 Encounter for other specified antenatal screening: Secondary | ICD-10-CM

## 2023-12-04 DIAGNOSIS — O0990 Supervision of high risk pregnancy, unspecified, unspecified trimester: Secondary | ICD-10-CM

## 2023-12-04 NOTE — Assessment & Plan Note (Addendum)
-   RNST and normotensive today. - Continues to take nifedipine  although she forgot today.  - Missed follow up growth US  at MFM yesterday. Encouraged to reschedule as soon as possible.

## 2023-12-04 NOTE — Progress Notes (Signed)
    Return Prenatal Note   Assessment/Plan   Plan  33 y.o. [id number] at [redacted]w[redacted]d presents for follow-up OB visit. Reviewed prenatal record including previous visit note.  Pre-existing hypertension affecting pregnancy, antepartum - RNST and normotensive today. - Continues to take nifedipine  although she forgot today.  - Missed follow up growth US  at MFM yesterday. Encouraged to reschedule as soon as possible.   Supervision of high risk pregnancy, antepartum - Reviewed kick counts and preterm labor warning signs. Instructed to call office or come to hospital with persistent headache, vision changes, regular contractions, leaking of fluid, decreased fetal movement or vaginal bleeding.   No orders of the defined types were placed in this encounter.  Return in about 2 weeks (around 12/18/2023) for ROB with NST.   No future appointments.  For next visit:  NST in one week, ROB with NST in 2 weeks     Subjective   32 y.o. [id number] at [redacted]w[redacted]d presents for this follow-up prenatal visit.  Patient doing well. Work schedule has changed which is why she missed her MFM US  yesterday.  Patient reports: Movement: Present Contractions: Irritability  Objective   Flow sheet Vitals: Pulse Rate: 96 BP: 112/83 Fundal Height: 32 cm Fetal Heart Rate (bpm): RNST Total weight gain: 18 lb 6.4 oz (8.346 kg)  General Appearance  No acute distress, well appearing, and well nourished Pulmonary   Normal work of breathing Neurologic   Alert and oriented to person, place, and time Psychiatric   Mood and affect within normal limits  Alexis Elliott, CNM  12/03/2509:14 AM

## 2023-12-04 NOTE — Assessment & Plan Note (Signed)
 Reviewed kick counts and preterm labor warning signs. Instructed to call office or come to hospital with persistent headache, vision changes, regular contractions, leaking of fluid, decreased fetal movement or vaginal bleeding.

## 2023-12-11 ENCOUNTER — Other Ambulatory Visit

## 2023-12-11 ENCOUNTER — Telehealth: Payer: Self-pay | Admitting: Obstetrics and Gynecology

## 2023-12-11 NOTE — Telephone Encounter (Signed)
 Reached out to pt to reschedule NST appt that was scheduled on 5/272025 at 10:15.  Could not leave a message bc call could not be completed at this time.

## 2023-12-12 ENCOUNTER — Encounter: Payer: Self-pay | Admitting: Obstetrics and Gynecology

## 2023-12-12 NOTE — Telephone Encounter (Signed)
 Reached out to pt (2x) to reschedule NST appt that was scheduled on 12/11/2023 at 10:15.  Could not leave a message bc voicemail never picked up.  Will send a MyChart letter to pt.

## 2023-12-13 ENCOUNTER — Ambulatory Visit

## 2023-12-18 ENCOUNTER — Telehealth: Payer: Self-pay | Admitting: Advanced Practice Midwife

## 2023-12-18 ENCOUNTER — Ambulatory Visit (INDEPENDENT_AMBULATORY_CARE_PROVIDER_SITE_OTHER): Admitting: Advanced Practice Midwife

## 2023-12-18 ENCOUNTER — Encounter: Payer: Self-pay | Admitting: Advanced Practice Midwife

## 2023-12-18 ENCOUNTER — Other Ambulatory Visit (HOSPITAL_COMMUNITY)
Admission: RE | Admit: 2023-12-18 | Discharge: 2023-12-18 | Disposition: A | Discharge status: Home / Self Care | Source: Ambulatory Visit | Attending: Advanced Practice Midwife | Admitting: Advanced Practice Midwife

## 2023-12-18 ENCOUNTER — Other Ambulatory Visit

## 2023-12-18 VITALS — BP 127/71 | HR 68 | Wt 193.5 lb

## 2023-12-18 DIAGNOSIS — O0993 Supervision of high risk pregnancy, unspecified, third trimester: Secondary | ICD-10-CM

## 2023-12-18 DIAGNOSIS — Z3A35 35 weeks gestation of pregnancy: Secondary | ICD-10-CM | POA: Insufficient documentation

## 2023-12-18 DIAGNOSIS — Z3685 Encounter for antenatal screening for Streptococcus B: Secondary | ICD-10-CM

## 2023-12-18 DIAGNOSIS — O10913 Unspecified pre-existing hypertension complicating pregnancy, third trimester: Secondary | ICD-10-CM

## 2023-12-18 DIAGNOSIS — Z113 Encounter for screening for infections with a predominantly sexual mode of transmission: Secondary | ICD-10-CM

## 2023-12-18 NOTE — Progress Notes (Signed)
 Routine Prenatal Care Visit  Subjective  Alexis Elliott is a 33 y.o. [phone] at [redacted]w[redacted]d being seen today for ongoing prenatal care.  She is currently monitored for the following issues for this high-risk pregnancy and has Supervision of high risk pregnancy, antepartum; Obesity affecting pregnancy; Sickle cell trait (HCC); Blood glucose abnormal; Pre-existing hypertension affecting pregnancy, antepartum; and Flank pain in pregnant patient on their problem list.  ----------------------------------------------------------------------------------- Patient reports she is doing well. Missed her last u/s due to car issues and she wants to schedule in [place] if possible. Alexis Elliott is mildly elevated today. She admits going through a break up currently and overall she is handling it well- "as long as I am bringing a healthy baby into the world".   Contractions: Not present. Vag. Bleeding: None.  Movement: Present. Leaking Fluid denies.  ----------------------------------------------------------------------------------- The following portions of the patient's history were reviewed and updated as appropriate: allergies, current medications, past family history, past medical history, past social history, past surgical history and problem list. Problem list updated.  Objective  Blood pressure 127/71, pulse 68, weight 193 lb 8 oz (87.8 kg), last menstrual period 04/14/2023. Pregravid weight 175 lb 9.6 oz (79.7 kg) Total Weight Gain 17 lb 14.4 oz (8.119 kg) Urinalysis: Urine Protein    Urine Glucose    Fetal Status: Fetal Heart Rate (bpm): 120   Movement: Present     NST: reactive 20 minute tracing, 120 bpm, moderate variability, +accelerations, -decelerations  General:  Alert, oriented and cooperative. Patient is in no acute distress.  Skin: Skin is warm and dry. No rash noted.   Cardiovascular: Normal heart rate noted  Respiratory: Normal respiratory effort, no problems with respiration noted  Abdomen:  Soft, gravid, appropriate for gestational age. Pain/Pressure: Absent     Pelvic:  Cervical exam deferred        Extremities: Normal range of motion.  Edema: None  Mental Status: Normal mood and affect. Normal behavior. Normal judgment and thought content.   Assessment   33 y.o. [id number] at [redacted]w[redacted]d by  01/19/2024, by Last Menstrual Period presenting for routine prenatal visit  Plan   November 2024 Problems (from 06/13/23 to present)     Problem Noted Diagnosed Resolved   Supervision of high risk pregnancy, antepartum 06/13/2023 by Delta Fila, CMA  No   Overview Addendum 08/03/2023  1:42 PM by Lendon Queen, CMA   Clinical Staff Provider  Office Location  [facility] Dating  By LMP c/w 6w u/s  Language  English Anatomy US     Flu Vaccine  offer Genetic Screen  NIPS: Neg/XX  TDaP vaccine   offer Hgb A1C or  GTT Early : Third trimester :   Covid No boosters   LAB RESULTS   Rhogam  A/Positive/-- (12/18 1107)  Blood Type A/Positive/-- (12/18 1107)   RSV offer Antibody Negative (12/18 1107)  Feeding Plan formula Rubella 6.89 (12/18 1107)  Contraception depo RPR Non Reactive (12/18 1107) Non reactive  08/16/21  Circumcision yes HBsAg Negative (12/18 1107)   Pediatrician  [facility] HIV Non Reactive (12/18 1107)Non reactive  08/16/21  Support Person Stravonte Varicella Reactive (12/18 1107)POSITIVE  03/01/20  Prenatal Classes no GBS  (For PCN allergy, check sensitivities)     Hep C Non Reactive (12/18 1107)   BTL Consent  Pap NEG; HPV NEG  10/03/19  VBAC Consent  Hgb Electro      CF   GC/CHLM NEG/NEG  01/14/22 SMA     HBsAb Reactive; quant >1,000.00  Preterm labor symptoms and general obstetric precautions including but not limited to vaginal bleeding, contractions, leaking of fluid and fetal movement were reviewed in detail with the patient. Please refer to After Visit Summary for other counseling recommendations.   Return in about 1 week (around 12/25/2023)  for rob/nst.  Angelita Kendall, CNM 12/18/2023 11:42 AM

## 2023-12-18 NOTE — Telephone Encounter (Signed)
 Pt had an appt at [facility] on 12/13/2023, but she did not go the appt.  She is having transportation issues.  She is wanting to see if she can get the appt rescheduled here in [place].  Is this possible?

## 2023-12-19 LAB — CERVICOVAGINAL ANCILLARY ONLY
Chlamydia: NEGATIVE
Comment: NEGATIVE
Comment: NORMAL
Neisseria Gonorrhea: NEGATIVE

## 2023-12-20 LAB — STREP GP B NAA: Strep Gp B NAA: NEGATIVE

## 2023-12-24 NOTE — Telephone Encounter (Signed)
 Patient advised she will need to contact MFM at [phone] to reschedule appointment. [facility] does not have availability until July, that's why her appointment was scheduled in [place]. Patient will call to reschedule.

## 2023-12-24 NOTE — Telephone Encounter (Signed)
No answer VM Full

## 2023-12-25 ENCOUNTER — Ambulatory Visit (INDEPENDENT_AMBULATORY_CARE_PROVIDER_SITE_OTHER): Admitting: Certified Nurse Midwife

## 2023-12-25 ENCOUNTER — Encounter: Payer: Self-pay | Admitting: Certified Nurse Midwife

## 2023-12-25 ENCOUNTER — Other Ambulatory Visit

## 2023-12-25 ENCOUNTER — Ambulatory Visit

## 2023-12-25 VITALS — BP 124/84 | HR 71 | Wt 197.6 lb

## 2023-12-25 DIAGNOSIS — O0993 Supervision of high risk pregnancy, unspecified, third trimester: Secondary | ICD-10-CM

## 2023-12-25 DIAGNOSIS — O10013 Pre-existing essential hypertension complicating pregnancy, third trimester: Secondary | ICD-10-CM | POA: Diagnosis not present

## 2023-12-25 DIAGNOSIS — Z3A35 35 weeks gestation of pregnancy: Secondary | ICD-10-CM

## 2023-12-25 DIAGNOSIS — O0990 Supervision of high risk pregnancy, unspecified, unspecified trimester: Secondary | ICD-10-CM

## 2023-12-25 DIAGNOSIS — O10913 Unspecified pre-existing hypertension complicating pregnancy, third trimester: Secondary | ICD-10-CM | POA: Diagnosis not present

## 2023-12-25 DIAGNOSIS — O10919 Unspecified pre-existing hypertension complicating pregnancy, unspecified trimester: Secondary | ICD-10-CM

## 2023-12-25 DIAGNOSIS — Z3A36 36 weeks gestation of pregnancy: Secondary | ICD-10-CM | POA: Diagnosis not present

## 2023-12-25 NOTE — Patient Instructions (Signed)
 Nonstress Test: What to Expect A nonstress test, also called an NST, is done during pregnancy to check your baby's heartbeat. The procedure can help to show if your baby is healthy. It may be done if: Your due date has passed. Your pregnancy is high risk. Your baby is moving less than normal. You've lost a previous pregnancy. Your baby is growing slowly. There's too much or too little fluid around your baby. The NST may be done in the third trimester to find out if it's best for your baby to be born early. During an NST, your baby's heartbeat is watched for at least 20 minutes. If the baby is healthy, the heart rate will go up when the baby moves and will return to normal when the baby rests. This should happen at least twice during the test. Tell a health care provider about: Any allergies you have. Any medical problems you have. All medicines you take. These include vitamins, herbs, eye drops, and creams. Any surgeries you've had. Any past pregnancies you've had. What are the risks? There are no risks to you or your baby from a nonstress test. This procedure shouldn't be painful or uncomfortable. What happens before? Eat a meal right before the test or as told by your health care team. Food may help the baby to move. Use the restroom right before the test. What happens during a nonstress test?  Two monitors will be placed on your belly. One will check your baby's heart rate, and the other will check for contractions. You may be asked to lie down on your side or to sit up. You may be given a button to press when you feel your baby move. If your baby seems to be sleeping, you may be asked to drink some juice or soda, eat a snack, or change positions. These steps may vary. Ask what you can expect. What can I expect after? Your team will talk with you about the results and tell you the next steps. If your team gave you any diet or activity instructions, make sure to follow them. Keep all  follow-up visits. This is important to check on your health and the health of your baby. This information is not intended to replace advice given to you by your health care provider. Make sure you discuss any questions you have with your health care provider. Document Revised: 06/28/2023 Document Reviewed: 06/28/2023 Elsevier Patient Education  2025 [employer].

## 2023-12-25 NOTE — Progress Notes (Signed)
    Return Prenatal Note   Assessment/Plan   Plan  33 y.o. [id number] at [redacted]w[redacted]d presents for follow-up OB visit. Reviewed prenatal record including previous visit note.  Pre-existing hypertension affecting pregnancy, antepartum [name] was not able to go to [facility] for her MFM US . Will come back to the office today for a growth US . Blood pressure normotensive today without symptoms. Will need to schedule for IOL at [place] between 37-38.6 weeks.   Supervision of high risk pregnancy, antepartum Feelings lots of back pain and cramping. Denies bleeding or LOF. 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 Reviewed comfort measures for pregnancy pain.  Reviewed kick counts and preterm labor warning signs. Instructed to call office or come to hospital with persistent headache, vision changes, regular contractions, leaking of fluid, decreased fetal movement or vaginal bleeding.    Orders Placed This Encounter  Procedures   US  OB Follow Up    Standing Status:   Future    Expected Date:   12/25/2023    Expiration Date:   12/24/2024    Reason for Exam (SYMPTOM  OR DIAGNOSIS REQUIRED):   growth    Preferred Imaging Location?:   Internal    Release to patient:   Immediate   No follow-ups on file.   Future Appointments  Date Time Provider Department Center  12/25/2023  1:00 PM [facility] US  1 [facility]OB-IMG None  01/01/2024 10:15 AM AOB-NST ROOM [facility] None  01/01/2024 11:15 AM Free, Verita Glassman, CNM [facility] None    For next visit:  ROB with NST one week     Subjective   32 y.o. [id number] at [redacted]w[redacted]d presents for this follow-up prenatal visit.  Patient reports increased back pain and braxton hicks contractions.  Patient reports: Movement: Present Contractions: Regular  Objective   Flow sheet Vitals: Pulse Rate: 71 BP: 124/84 Fundal Height: 36 cm Fetal Heart Rate (bpm): 130 Total weight gain: 22 lb (9.979 kg)  General Appearance  No acute distress, well appearing, and well  nourished Pulmonary   Normal work of breathing Neurologic   Alert and oriented to person, place, and time Psychiatric   Mood and affect within normal limits  MARYBETH DANDY March 01, 1991 [redacted]w[redacted]d  Fetus A Non-Stress Test Interpretation for 12/25/23  Indication: Chronic Hypertenstion  Fetal Heart Rate A Mode: External Baseline Rate (A): 120 bpm Variability: Moderate Accelerations: 15 x 15 Decelerations: None Scalp Stimulation: Negative Multiple birth?: No  Uterine Activity Mode: Toco Contraction Frequency (min): None  Interpretation (Fetal Testing) Nonstress Test Interpretation: Reactive Overall Impression: Reassuring for gestational age    Donato Fu, CNM  06/10/259:27 AM

## 2023-12-25 NOTE — Assessment & Plan Note (Signed)
 Alexis Elliott was not able to go to [facility] for her MFM US . Will come back to the office today for a growth US . Blood pressure normotensive today without symptoms. Will need to schedule for IOL at [place] between 37-38.6 weeks.

## 2023-12-25 NOTE — Assessment & Plan Note (Signed)
 Feelings lots of back pain and cramping. Denies bleeding or LOF. 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 Reviewed comfort measures for pregnancy pain.  Reviewed kick counts and preterm labor warning signs. Instructed to call office or come to hospital with persistent headache, vision changes, regular contractions, leaking of fluid, decreased fetal movement or vaginal bleeding.

## 2023-12-27 DIAGNOSIS — Z419 Encounter for procedure for purposes other than remedying health state, unspecified: Secondary | ICD-10-CM | POA: Diagnosis not present

## 2023-12-30 ENCOUNTER — Inpatient Hospital Stay: Admitting: Anesthesiology

## 2023-12-30 ENCOUNTER — Inpatient Hospital Stay
Admission: EM | Admit: 2023-12-30 | Discharge: 2024-01-01 | DRG: 787 | Disposition: A | Discharge status: Home / Self Care | Attending: Obstetrics | Admitting: Obstetrics

## 2023-12-30 ENCOUNTER — Other Ambulatory Visit: Payer: Self-pay

## 2023-12-30 ENCOUNTER — Encounter: Payer: Self-pay | Admitting: Obstetrics and Gynecology

## 2023-12-30 ENCOUNTER — Other Ambulatory Visit: Payer: Self-pay | Admitting: Obstetrics and Gynecology

## 2023-12-30 ENCOUNTER — Encounter
Admission: EM | Disposition: A | Discharge status: Home / Self Care | Payer: Self-pay | Source: Home / Self Care | Attending: Obstetrics

## 2023-12-30 DIAGNOSIS — O1092 Unspecified pre-existing hypertension complicating childbirth: Secondary | ICD-10-CM | POA: Diagnosis present

## 2023-12-30 DIAGNOSIS — O4202 Full-term premature rupture of membranes, onset of labor within 24 hours of rupture: Secondary | ICD-10-CM | POA: Diagnosis not present

## 2023-12-30 DIAGNOSIS — O9912 Other diseases of the blood and blood-forming organs and certain disorders involving the immune mechanism complicating childbirth: Secondary | ICD-10-CM

## 2023-12-30 DIAGNOSIS — D573 Sickle-cell trait: Secondary | ICD-10-CM | POA: Diagnosis present

## 2023-12-30 DIAGNOSIS — Z3A37 37 weeks gestation of pregnancy: Secondary | ICD-10-CM

## 2023-12-30 DIAGNOSIS — Z8249 Family history of ischemic heart disease and other diseases of the circulatory system: Secondary | ICD-10-CM

## 2023-12-30 DIAGNOSIS — O10919 Unspecified pre-existing hypertension complicating pregnancy, unspecified trimester: Secondary | ICD-10-CM | POA: Diagnosis present

## 2023-12-30 DIAGNOSIS — O9902 Anemia complicating childbirth: Secondary | ICD-10-CM | POA: Diagnosis not present

## 2023-12-30 DIAGNOSIS — O0990 Supervision of high risk pregnancy, unspecified, unspecified trimester: Principal | ICD-10-CM

## 2023-12-30 DIAGNOSIS — O321XX0 Maternal care for breech presentation, not applicable or unspecified: Secondary | ICD-10-CM | POA: Diagnosis present

## 2023-12-30 DIAGNOSIS — O1002 Pre-existing essential hypertension complicating childbirth: Secondary | ICD-10-CM | POA: Diagnosis not present

## 2023-12-30 DIAGNOSIS — O4292 Full-term premature rupture of membranes, unspecified as to length of time between rupture and onset of labor: Principal | ICD-10-CM | POA: Diagnosis present

## 2023-12-30 DIAGNOSIS — Z833 Family history of diabetes mellitus: Secondary | ICD-10-CM | POA: Diagnosis not present

## 2023-12-30 DIAGNOSIS — Z98891 History of uterine scar from previous surgery: Secondary | ICD-10-CM

## 2023-12-30 LAB — CBC
HCT: 37.8 % (ref 36.0–46.0)
Hemoglobin: 12.3 g/dL (ref 12.0–15.0)
MCH: 26.2 pg (ref 26.0–34.0)
MCHC: 32.5 g/dL (ref 30.0–36.0)
MCV: 80.6 fL (ref 80.0–100.0)
Platelets: 289 10*3/uL (ref 150–400)
RBC: 4.69 MIL/uL (ref 3.87–5.11)
RDW: 14.5 % (ref 11.5–15.5)
WBC: 9.6 10*3/uL (ref 4.0–10.5)
nRBC: 0 % (ref 0.0–0.2)

## 2023-12-30 LAB — COMPREHENSIVE METABOLIC PANEL WITH GFR
ALT: 27 U/L (ref 0–44)
AST: 26 U/L (ref 15–41)
Albumin: 3 g/dL — ABNORMAL LOW (ref 3.5–5.0)
Alkaline Phosphatase: 156 U/L — ABNORMAL HIGH (ref 38–126)
Anion gap: 9 (ref 5–15)
BUN: 8 mg/dL (ref 6–20)
CO2: 20 mmol/L — ABNORMAL LOW (ref 22–32)
Calcium: 8.9 mg/dL (ref 8.9–10.3)
Chloride: 106 mmol/L (ref 98–111)
Creatinine, Ser: 0.81 mg/dL (ref 0.44–1.00)
GFR, Estimated: >60 mL/min (ref >60)
Glucose, Bld: 73 mg/dL (ref 70–99)
Potassium: 3.8 mmol/L (ref 3.5–5.1)
Sodium: 135 mmol/L (ref 135–145)
Total Bilirubin: 0.7 mg/dL (ref 0.0–1.2)
Total Protein: 7 g/dL (ref 6.5–8.1)

## 2023-12-30 LAB — TYPE AND SCREEN
ABO/RH(D): A POS
Antibody Screen: NEGATIVE

## 2023-12-30 LAB — PROTEIN / CREATININE RATIO, URINE
Creatinine, Urine: 75 mg/dL
Protein Creatinine Ratio: 0.11 mg/mg{creat} (ref 0.00–0.15)
Total Protein, Urine: 8 mg/dL

## 2023-12-30 LAB — RUPTURE OF MEMBRANE (ROM)PLUS: Rom Plus: POSITIVE

## 2023-12-30 SURGERY — Surgical Case
Anesthesia: Spinal

## 2023-12-30 MED ORDER — BUPIVACAINE IN DEXTROSE 0.75-8.25 % IT SOLN
INTRATHECAL | Status: DC | PRN
  Administered 2023-12-30: 1.5 mL via INTRATHECAL

## 2023-12-30 MED ORDER — LIDOCAINE HCL (PF) 1 % IJ SOLN: 30.0000 mL | INTRAMUSCULAR | Status: DC | PRN

## 2023-12-30 MED ORDER — NALOXONE HCL 0.4 MG/ML IJ SOLN: 0.4000 mg | INTRAMUSCULAR | Status: DC | PRN

## 2023-12-30 MED ORDER — NIFEDIPINE ER OSMOTIC RELEASE 30 MG PO TB24
30.0000 mg | ORAL_TABLET | Freq: Every day | ORAL | Status: DC
  Administered 2023-12-30 – 2024-01-01 (×3): 30 mg via ORAL
  Filled 2023-12-30 (×3): qty 1

## 2023-12-30 MED ORDER — OXYTOCIN-SODIUM CHLORIDE 30-0.9 UT/500ML-% IV SOLN
2.5000 [IU]/h | INTRAVENOUS | Status: AC
  Administered 2023-12-30: 2.5 [IU]/h via INTRAVENOUS
  Filled 2023-12-30: qty 500

## 2023-12-30 MED ORDER — DIPHENHYDRAMINE HCL 25 MG PO CAPS
25.0000 mg | ORAL_CAPSULE | Freq: Four times a day (QID) | ORAL | Status: AC | PRN
Start: 2023-12-30 — End: ?

## 2023-12-30 MED ORDER — ONDANSETRON HCL 4 MG/2ML IJ SOLN
4.0000 mg | Freq: Three times a day (TID) | INTRAMUSCULAR | Status: AC | PRN
Start: 2023-12-30 — End: ?

## 2023-12-30 MED ORDER — SOD CITRATE-CITRIC ACID 500-334 MG/5ML PO SOLN
30.0000 mL | ORAL | Status: AC
  Administered 2023-12-30: 30 mL via ORAL
  Filled 2023-12-30: qty 30

## 2023-12-30 MED ORDER — CEFAZOLIN SODIUM-DEXTROSE 2-4 GM/100ML-% IV SOLN
2.0000 g | INTRAVENOUS | Status: AC
  Administered 2023-12-30: 2 g via INTRAVENOUS
  Filled 2023-12-30: qty 100

## 2023-12-30 MED ORDER — DIPHENHYDRAMINE HCL 25 MG PO CAPS: 25.0000 mg | ORAL_CAPSULE | ORAL | Status: DC | PRN

## 2023-12-30 MED ORDER — MENTHOL 3 MG MT LOZG: 1.0000 | LOZENGE | OROMUCOSAL | Status: DC | PRN

## 2023-12-30 MED ORDER — FENTANYL CITRATE (PF) 100 MCG/2ML IJ SOLN
INTRAMUSCULAR | Status: DC | PRN
  Administered 2023-12-30: 15 ug via INTRATHECAL

## 2023-12-30 MED ORDER — KETOROLAC TROMETHAMINE 30 MG/ML IJ SOLN
30.0000 mg | Freq: Four times a day (QID) | INTRAMUSCULAR | Status: AC | PRN
  Administered 2023-12-30: 30 mg via INTRAVENOUS
  Filled 2023-12-30: qty 1

## 2023-12-30 MED ORDER — SENNOSIDES-DOCUSATE SODIUM 8.6-50 MG PO TABS
2.0000 | ORAL_TABLET | ORAL | Status: DC
  Administered 2023-12-31: 2 via ORAL
  Filled 2023-12-30: qty 2

## 2023-12-30 MED ORDER — SODIUM CHLORIDE 0.9% FLUSH: 3.0000 mL | INTRAVENOUS | Status: DC | PRN

## 2023-12-30 MED ORDER — PRENATAL MULTIVITAMIN CH
1.0000 | ORAL_TABLET | Freq: Every day | ORAL | Status: DC
  Administered 2023-12-31 – 2024-01-01 (×2): 1 via ORAL
  Filled 2023-12-30 (×2): qty 1

## 2023-12-30 MED ORDER — OXYTOCIN BOLUS FROM INFUSION: 333.0000 mL | Freq: Once | INTRAVENOUS | Status: DC

## 2023-12-30 MED ORDER — ZOLPIDEM TARTRATE 5 MG PO TABS: 5.0000 mg | ORAL_TABLET | Freq: Every evening | ORAL | Status: DC | PRN

## 2023-12-30 MED ORDER — LACTATED RINGERS IV SOLN: INTRAVENOUS | Status: DC

## 2023-12-30 MED ORDER — OXYTOCIN-SODIUM CHLORIDE 30-0.9 UT/500ML-% IV SOLN
2.5000 [IU]/h | INTRAVENOUS | Status: DC
  Administered 2023-12-30: 30 [IU] via INTRAVENOUS
  Filled 2023-12-30: qty 500

## 2023-12-30 MED ORDER — NALOXONE HCL 4 MG/10ML IJ SOLN: 1.0000 ug/kg/h | INTRAVENOUS | Status: DC | PRN

## 2023-12-30 MED ORDER — SIMETHICONE 80 MG PO CHEW
80.0000 mg | CHEWABLE_TABLET | Freq: Four times a day (QID) | ORAL | Status: DC
  Administered 2023-12-31 – 2024-01-01 (×4): 80 mg via ORAL
  Filled 2023-12-30 (×5): qty 1

## 2023-12-30 MED ORDER — LIDOCAINE 5 % EX PTCH
MEDICATED_PATCH | CUTANEOUS | Status: AC
  Filled 2023-12-30: qty 1

## 2023-12-30 MED ORDER — SCOPOLAMINE 1 MG/3DAYS TD PT72: 1.0000 | MEDICATED_PATCH | Freq: Once | TRANSDERMAL | Status: DC

## 2023-12-30 MED ORDER — OXYCODONE HCL 5 MG PO TABS: 5.0000 mg | ORAL_TABLET | Freq: Four times a day (QID) | ORAL | Status: DC | PRN

## 2023-12-30 MED ORDER — DEXAMETHASONE SODIUM PHOSPHATE 10 MG/ML IJ SOLN
INTRAMUSCULAR | Status: DC | PRN
  Administered 2023-12-30: 10 mg via INTRAVENOUS

## 2023-12-30 MED ORDER — ONDANSETRON HCL 4 MG/2ML IJ SOLN
INTRAMUSCULAR | Status: AC
Start: 2023-12-30 — End: 2023-12-30
  Filled 2023-12-30: qty 2

## 2023-12-30 MED ORDER — ACETAMINOPHEN 325 MG PO TABS
650.0000 mg | ORAL_TABLET | ORAL | Status: DC | PRN
Start: 2023-12-30 — End: 2023-12-30

## 2023-12-30 MED ORDER — KETOROLAC TROMETHAMINE 30 MG/ML IJ SOLN: 30.0000 mg | Freq: Four times a day (QID) | INTRAMUSCULAR | Status: AC | PRN

## 2023-12-30 MED ORDER — PHENYLEPHRINE HCL-NACL 20-0.9 MG/250ML-% IV SOLN
INTRAVENOUS | Status: AC
  Filled 2023-12-30: qty 250

## 2023-12-30 MED ORDER — MORPHINE SULFATE (PF) 0.5 MG/ML IJ SOLN
INTRAMUSCULAR | Status: AC
Start: 2023-12-30 — End: 2023-12-30
  Filled 2023-12-30: qty 10

## 2023-12-30 MED ORDER — DEXAMETHASONE SODIUM PHOSPHATE 10 MG/ML IJ SOLN
INTRAMUSCULAR | Status: AC
  Filled 2023-12-30: qty 1

## 2023-12-30 MED ORDER — FENTANYL CITRATE (PF) 100 MCG/2ML IJ SOLN
INTRAMUSCULAR | Status: AC
  Filled 2023-12-30: qty 2

## 2023-12-30 MED ORDER — LACTATED RINGERS IV SOLN
500.0000 mL | INTRAVENOUS | Status: DC | PRN
  Administered 2023-12-30: 1000 mL via INTRAVENOUS

## 2023-12-30 MED ORDER — MORPHINE SULFATE (PF) 0.5 MG/ML IJ SOLN
INTRAMUSCULAR | Status: DC | PRN
  Administered 2023-12-30: .1 mg via INTRATHECAL

## 2023-12-30 MED ORDER — IBUPROFEN 600 MG PO TABS
600.0000 mg | ORAL_TABLET | Freq: Four times a day (QID) | ORAL | Status: DC
  Administered 2023-12-31 – 2024-01-01 (×6): 600 mg via ORAL
  Filled 2023-12-30 (×6): qty 1

## 2023-12-30 MED ORDER — PHENYLEPHRINE HCL-NACL 20-0.9 MG/250ML-% IV SOLN
INTRAVENOUS | Status: DC | PRN
  Administered 2023-12-30: 50 ug/min via INTRAVENOUS

## 2023-12-30 MED ORDER — OXYCODONE-ACETAMINOPHEN 5-325 MG PO TABS
1.0000 | ORAL_TABLET | ORAL | Status: DC | PRN
  Administered 2023-12-31 – 2024-01-01 (×3): 1 via ORAL
  Filled 2023-12-30 (×3): qty 1

## 2023-12-30 MED ORDER — DIPHENHYDRAMINE HCL 50 MG/ML IJ SOLN: 12.5000 mg | INTRAMUSCULAR | Status: DC | PRN

## 2023-12-30 MED ORDER — ONDANSETRON HCL 4 MG/2ML IJ SOLN
INTRAMUSCULAR | Status: DC | PRN
  Administered 2023-12-30: 4 mg via INTRAVENOUS

## 2023-12-30 MED ORDER — SODIUM CHLORIDE 0.9 % IV SOLN
500.0000 mg | INTRAVENOUS | Status: AC
  Administered 2023-12-30: 500 mg via INTRAVENOUS
  Filled 2023-12-30: qty 5

## 2023-12-30 SURGICAL SUPPLY — 28 items
ADHESIVE MASTISOL STRL (MISCELLANEOUS) ×1 IMPLANT
BAG COUNTER SPONGE SURGICOUNT (BAG) ×1 IMPLANT
BENZOIN TINCTURE PRP APPL 2/3 (GAUZE/BANDAGES/DRESSINGS) IMPLANT
CHLORAPREP W/TINT 26 (MISCELLANEOUS) ×2 IMPLANT
DRSG TELFA 3X8 NADH (GAUZE/BANDAGES/DRESSINGS) ×1 IMPLANT
DRSG TELFA 3X8 NADH STRL (GAUZE/BANDAGES/DRESSINGS) ×1 IMPLANT
GAUZE SPONGE 4X4 12PLY STRL (GAUZE/BANDAGES/DRESSINGS) ×1 IMPLANT
GLOVE PI ORTHO PRO STRL 7.5 (GLOVE) ×1 IMPLANT
GOWN STRL REUS W/ TWL LRG LVL3 (GOWN DISPOSABLE) ×2 IMPLANT
KIT TURNOVER KIT A (KITS) ×1 IMPLANT
MANIFOLD NEPTUNE II (INSTRUMENTS) ×1 IMPLANT
MAT PREVALON FULL STRYKER (MISCELLANEOUS) ×1 IMPLANT
NS IRRIG 1000ML POUR BTL (IV SOLUTION) ×1 IMPLANT
PACK C SECTION AR (MISCELLANEOUS) ×1 IMPLANT
PAD ABD 7.5X8 STRL (GAUZE/BANDAGES/DRESSINGS) IMPLANT
PAD DRESSING TELFA 3X8 NADH (GAUZE/BANDAGES/DRESSINGS) IMPLANT
PAD OB MATERNITY 11 LF (PERSONAL CARE ITEMS) ×1 IMPLANT
PAD PREP OB/GYN DISP 24X41 (PERSONAL CARE ITEMS) ×1 IMPLANT
RETRACTOR TRAXI PANNICULUS (MISCELLANEOUS) IMPLANT
RETRACTOR WND ALEXIS-O 25 LRG (MISCELLANEOUS) ×1 IMPLANT
SCRUB CHG 4% DYNA-HEX 4OZ (MISCELLANEOUS) ×1 IMPLANT
SPONGE T-LAP 18X18 ~~LOC~~+RFID (SPONGE) ×1 IMPLANT
SUT VIC AB 0 CTX36XBRD ANBCTRL (SUTURE) ×2 IMPLANT
SUT VIC AB 1 CT1 36 (SUTURE) ×2 IMPLANT
SUT VICRYL+ 3-0 36IN CT-1 (SUTURE) ×2 IMPLANT
TAPE MEDIFIX FOAM 3 (GAUZE/BANDAGES/DRESSINGS) IMPLANT
TRAP FLUID SMOKE EVACUATOR (MISCELLANEOUS) ×1 IMPLANT
WATER STERILE IRR 500ML POUR (IV SOLUTION) ×1 IMPLANT

## 2023-12-30 NOTE — Anesthesia Preprocedure Evaluation (Signed)
 Anesthesia Evaluation  Patient identified by MRN, date of birth, ID band Patient awake    Reviewed: Allergy & Precautions, NPO status , Patient's Chart, lab work & pertinent test results  History of Anesthesia Complications Negative for: history of anesthetic complications  Airway Mallampati: III  TM Distance: >3 FB Neck ROM: full    Dental no notable dental hx.    Pulmonary neg pulmonary ROS   Pulmonary exam normal        Cardiovascular Exercise Tolerance: Good hypertension, On Medications negative cardio ROS Normal cardiovascular exam     Neuro/Psych negative neurological ROS  negative psych ROS   GI/Hepatic negative GI ROS, Neg liver ROS,,,  Endo/Other  negative endocrine ROS    Renal/GU negative Renal ROS  negative genitourinary   Musculoskeletal   Abdominal   Peds  Hematology negative hematology ROS (+)   Anesthesia Other Findings Past Medical History: 10/2022: Ectopic pregnancy No date: Hypertension No date: Sickle cell trait [facility])  Past Surgical History: 11/14/2022: DIAGNOSTIC LAPAROSCOPY WITH REMOVAL OF ECTOPIC PREGNANCY;  Left     Comment:  Procedure: DIAGNOSTIC LAPAROSCOPY, LEFT SALPINGECTOMY               WITH REMOVAL OF ECTOPIC PREGNANCY;  Surgeon: Teresa Fender, MD;  Location: [facility] ORS;  Service: Gynecology;                Laterality: Left; No date: FINGER SURGERY  BMI    Body Mass Index: 36.09 kg/m      Reproductive/Obstetrics (+) Pregnancy                             Anesthesia Physical Anesthesia Plan  ASA: 2  Anesthesia Plan: Spinal   Post-op Pain Management:    Induction:   PONV Risk Score and Plan:   Airway Management Planned: Natural Airway and Nasal Cannula  Additional Equipment:   Intra-op Plan:   Post-operative Plan:   Informed Consent: I have reviewed the patients History and Physical, chart, labs and discussed the  procedure including the risks, benefits and alternatives for the proposed anesthesia with the patient or authorized representative who has indicated his/her understanding and acceptance.     Dental Advisory Given  Plan Discussed with: Anesthesiologist  Anesthesia Plan Comments: (Patient reports no bleeding problems and no anticoagulant use.  Plan for spinal with backup GA  Patient consented for risks of anesthesia including but not limited to:  - adverse reactions to medications - damage to eyes, teeth, lips or other oral mucosa - nerve damage due to positioning  - risk of bleeding, infection and or nerve damage from spinal that could lead to paralysis - risk of headache or failed spinal - damage to teeth, lips or other oral mucosa - sore throat or hoarseness - damage to heart, brain, nerves, lungs, other parts of body or loss of life  Patient voiced understanding and assent.)       Anesthesia Quick Evaluation

## 2023-12-30 NOTE — Op Note (Signed)
     OP NOTE  Date: 12/30/2023   11:23 AM Name Alexis Elliott MR# [id number]  Preoperative Diagnosis: 1. Intrauterine pregnancy at [redacted]w[redacted]d Principal Problem:   Indication for care in labor or delivery Active Problems:   Labor and delivery, indication for care  2.  malpresentation: Breech  Postoperative Diagnosis: 1. Intrauterine pregnancy at [redacted]w[redacted]d, delivered 2. Viable infant 3. Remainder same as pre-op   Procedure: 1. Primary Low-Transverse Cesarean Section  Surgeon: Delice Felt, MD  Assistant:  Aisha Ali CNM  Anesthesia:  Spinal   EBL: 125 ml     Findings: 1) female infant, Apgar scores of    at 1 minute and    at 5 minutes and a birthweight of   ounces.    2) Normal uterus, tubes and ovaries.    Procedure:  The patient was prepped and draped in the supine position and placed under spinal anesthesia.  A transverse incision was made across the abdomen in a Pfannenstiel manner. If indicated the old scar was systematically removed with sharp dissection.  We carried the dissection down to the level of the fascia.  The fascia was incised in a curvilinear manner.  The fascia was then elevated from the rectus muscles with blunt and sharp dissection.  The rectus muscles were separated laterally exposing the peritoneum.  The peritoneum was carefully entered with care being taken to avoid bowel and bladder.  A self-retaining retractor was placed.  The visceral peritoneum was incised in a curvilinear fashion across the lower uterine segment creating a bladder flap. A transverse incision was made across the lower uterine segment and extended laterally and superiorly with blunt dissection.  Artificial rupture membranes was performed and Clear fluid was noted.  The infant was delivered from the breech Samuel Crock) position.  A nuchal cord was not present. After an appropriate time interval, the cord was doubly clamped and cut. Cord blood was obtained if required.  The infant was handed to the  pediatric personnel  who then placed the infant under heat lamps where it was cleaned dried and suctioned as needed. The placenta was delivered. The hysterotomy incision was then identified on ring forceps.  The uterine cavity was cleaned with a moist lap sponge.  The hysterotomy incision was closed with a running interlocking suture of Vicryl.  Hemostasis was excellent.  Pitocin was run in the IV and the uterus was found to be firm. The posterior cul-de-sac and gutters were cleaned and inspected.  Hemostasis was noted.  The fascia was then closed with a running suture of #1 Vicryl.  Hemostasis of the subcutaneous tissues was obtained using the Bovie.  The subcutaneous tissues were closed with a running suture of 000 Vicryl.  A subcuticular suture was placed.  Steri-strips were applied in the usual manner.  A Lidoderm  patch was applied.  A pressure dressing was placed.  The patient went to the recovery room in stable condition. Swanson CNM provided exposure, dissection, suctioning, retraction, and general support and assistance during the procedure.   Delice Felt, M.D. 12/30/2023 11:23 AM

## 2023-12-30 NOTE — Transfer of Care (Signed)
 Immediate Anesthesia Transfer of Care Note  Patient: Alexis Elliott  Procedure(s) Performed: CESAREAN DELIVERY  Patient Location: Mother/Baby  Anesthesia Type:Spinal  Level of Consciousness: awake, alert , and oriented  Airway & Oxygen Therapy: Patient Spontanous Breathing  Post-op Assessment: Report given to RN and Post -op Vital signs reviewed and stable  Post vital signs: Reviewed  Last Vitals:  Vitals Value Taken Time  BP 107/76   Temp 97.34f   Pulse 67   Resp 16   SpO2 99     Last Pain:  Vitals:   12/30/23 0845  TempSrc: Oral  PainSc:       Patients Stated Pain Goal: 0 (12/30/23 0840)  Complications: No notable events documented.

## 2023-12-30 NOTE — H&P (Signed)
 [facility] Labor & Delivery  History and Physical  ASSESSMENT AND PLAN   Alexis Elliott is a 33 y.o. [id number] at [redacted]w[redacted]d with EDD: 01/19/2024, by Last Menstrual Period admitted for PROM and breech presentation.  1. Admit to L&D 2. EFM: continuous -- Category 1 3. NPO 4. Admission and preeclampsia labs  5. Dr. Luster Salters notified of findings  6. Anesthesia notified 7. Plan to proceed with cesarean birth  Fetal Status: - breech presentation by Leopold's and BSUS - EFW: 5 lbs. 10 oz by US  12/25/23 - FHT currently category 1  Labs/Immunizations: Prenatal labs and studies: ABO, Rh: A/Positive/-- (12/18 1107) Antibody: Negative (12/18 1107) Rubella: 6.89 (12/18 1107) Varicella: Reactive (12/18 1107)  RPR: Non Reactive (04/10 1425)  HBsAg: Negative (12/18 1107)  HepC: Non Reactive (12/18 1107) HIV: Non Reactive (04/10 1425)  GBS: Negative/-- (06/03 1209)   TDAP: 10/25/23 Flu: no RSV: no  Postpartum Plan: - Feeding: Formula - Contraception: plans Depo-Provera  - Prenatal Care Provider: AOB     HPI   Chief Complaint: PROM  Alexis Elliott is a 33 y.o. [id number] at [redacted]w[redacted]d who presents for PROM. She noticed a gush of clear fluid at 0725 this morning and has continued to leak since then. She is feeling mild contractions. She denies vaginal bleeding. She endorses good fetal movement.  Her pregnancy is complicated by [facility] which has been well-controlled on 30 mg Procardia  XL. She denies HA, visual changes, and epigastric pain. She has had nothing by mouth since 2100 last night.   Pregnancy Complications Patient Active Problem List   Diagnosis Date Noted   Indication for care in labor or delivery 12/30/2023   Labor and delivery, indication for care 12/30/2023   Flank pain in pregnant patient 11/15/2023   Pre-existing hypertension affecting pregnancy, antepartum 08/03/2023   Blood glucose abnormal 07/12/2023   Obesity affecting pregnancy 07/04/2023   Sickle cell  trait (HCC)    Supervision of high risk pregnancy, antepartum 06/13/2023    Review of Systems A twelve point review of systems was negative except as stated in HPI.   HISTORY   Medications Medications Prior to Admission  Medication Sig Dispense Refill Last Dose/Taking   acetaminophen  (TYLENOL ) 325 MG tablet Take 650 mg by mouth every 6 (six) hours as needed.      Blood Pressure Monitoring (BLOOD PRESSURE KIT) DEVI 1 kit by Does not apply route daily. 1 each 0    LOW-DOSE ASPIRIN PO Take by mouth.      NIFEdipine  (PROCARDIA -XL/NIFEDICAL-XL) 30 MG 24 hr tablet Take 1 tablet (30 mg total) by mouth daily. 90 tablet 3    prenatal vitamin w/FE, FA (NATACHEW) 29-1 MG CHEW chewable tablet Chew 1 tablet by mouth daily at 12 noon.       Allergies has no known allergies.   OB History OB History  Gravida Para Term Preterm AB Living  6 1 1  0 4 1  SAB IAB Ectopic Multiple Live Births  0 3 1 0 1    # Outcome Date GA Lbr Len/2nd Weight Sex Type Anes PTL Lv  6 Current           5 Ectopic 10/2022     ECTOPIC     4 IAB 2022          3 IAB 2021          2 IAB 2019          1 Term 10/15/06 [redacted]w[redacted]d  2977 g  F Vag-Spont  N LIV    Past Medical History Past Medical History:  Diagnosis Date   Ectopic pregnancy 10/2022   Hypertension    Sickle cell trait [facility])     Past Surgical History Past Surgical History:  Procedure Laterality Date   DIAGNOSTIC LAPAROSCOPY WITH REMOVAL OF ECTOPIC PREGNANCY Left 11/14/2022   Procedure: DIAGNOSTIC LAPAROSCOPY, LEFT SALPINGECTOMY WITH REMOVAL OF ECTOPIC PREGNANCY;  Surgeon: Teresa Fender, MD;  Location: [facility] ORS;  Service: Gynecology;  Laterality: Left;   FINGER SURGERY      Social History  reports that she has never smoked. She has never used smokeless tobacco. She reports that she does not currently use alcohol. She reports that she does not currently use drugs after having used the following drugs: Marijuana. Frequency: 2.00 times per week.   Family  History family history includes Diabetes in her maternal grandmother and paternal grandmother; Healthy in her half-sister, maternal grandfather, and sister; Hypertension in her maternal grandmother, mother, paternal grandfather, and paternal grandmother; Post-traumatic stress disorder in her sister; Sickle cell trait in her father.   PHYSICAL EXAM    BP (!) 129/94 (BP Location: Right Arm)   Pulse 80   Temp 97.7 F (36.5 C) (Oral)   Resp 18   Ht 5' 2 (1.575 m)   Wt 89.5 kg   LMP 04/14/2023 (Exact Date)   BMI 36.09 kg/m    Constitutional: No acute distress, well appearing, and well nourished. Neurologic: She is alert and conversational.  Psychiatric: She has a normal mood and affect.  Musculoskeletal: Normal gait, grossly normal range of motion Cardiovascular: Normal rate.   Pulmonary/Chest: Normal work of breathing.  Gastrointestinal/Abdominal: Soft. Gravid. There is no tenderness.  Skin: Skin is warm and dry. No rash noted.  Genitourinary: Normal external female genitalia.  SVE:   Dilation: Closed Exam by:: Cinda Craze, CNM   NST Interpretation  Baseline: 125 bpm Variability: moderate Accelerations: present Decelerations: none Contractions: irregular, mild Impression: reactive  Phylliss Brenner, CNM

## 2023-12-30 NOTE — Anesthesia Procedure Notes (Signed)
 Spinal  Patient location during procedure: OR Reason for block: surgical anesthesia Staffing Performed: resident/CRNA  Resident/CRNA: Kavin Parsley, CRNA Performed by: Kavin Parsley, CRNA Authorized by: Nancey Awkward, MD   Preanesthetic Checklist Completed: patient identified, IV checked, site marked, risks and benefits discussed, surgical consent, monitors and equipment checked, pre-op evaluation and timeout performed Spinal Block Patient position: sitting Prep: Betadine Patient monitoring: heart rate, continuous pulse ox, blood pressure and cardiac monitor Approach: midline Location: L4-5 Injection technique: single-shot Needle Needle type: Whitacre and Introducer  Needle gauge: 25 G Needle length: 9 cm Assessment Sensory level: T4 Events: CSF return Additional Notes Negative paresthesia. Negative blood return. Positive free-flowing CSF. Expiration date of kit checked and confirmed. Patient tolerated procedure well, without complications.

## 2023-12-30 NOTE — OB Triage Note (Signed)
 Patient is a [id number], [redacted]w[redacted]d that presents with possible SROM at 0725. Patient states that she is feeling pressure in her back and has tightening of her abdomen. Patient rates her pain a 6 on a 0-10 pain scale. Patient reports small gushes of fluid. VSS. +FM. Monitors applied and assessing. Patient did report last week in office, she was told that baby was breech.

## 2023-12-31 ENCOUNTER — Encounter: Payer: Self-pay | Admitting: Obstetrics and Gynecology

## 2023-12-31 LAB — CBC
HCT: 32.5 % — ABNORMAL LOW (ref 36.0–46.0)
Hemoglobin: 10.6 g/dL — ABNORMAL LOW (ref 12.0–15.0)
MCH: 26.3 pg (ref 26.0–34.0)
MCHC: 32.6 g/dL (ref 30.0–36.0)
MCV: 80.6 fL (ref 80.0–100.0)
Platelets: 267 10*3/uL (ref 150–400)
RBC: 4.03 MIL/uL (ref 3.87–5.11)
RDW: 14.4 % (ref 11.5–15.5)
WBC: 17.2 10*3/uL — ABNORMAL HIGH (ref 4.0–10.5)
nRBC: 0 % (ref 0.0–0.2)

## 2023-12-31 LAB — RPR: RPR Ser Ql: NONREACTIVE

## 2023-12-31 NOTE — Anesthesia Post-op Follow-up Note (Signed)
  Anesthesia Pain Follow-up Note  Patient: Alexis Elliott  Day #: 1  Date of Follow-up: 12/31/2023 Time: 8:03 AM  Last Vitals:  Vitals:   12/30/23 2305 12/31/23 0253  BP: 109/69 106/63  Pulse: (!) 105 72  Resp: 18 18  Temp: 36.9 C 36.8 C  SpO2: 100% 100%    Level of Consciousness: alert  Pain: none   Side Effects:None  Catheter Site Exam:clean, dry     Plan: D/C from anesthesia care at surgeon's request  Baby Lessen

## 2023-12-31 NOTE — Anesthesia Postprocedure Evaluation (Signed)
 Anesthesia Post Note  Patient: Alexis Elliott  Procedure(s) Performed: CESAREAN DELIVERY  Patient location during evaluation: Mother Baby Anesthesia Type: Spinal Level of consciousness: oriented and awake and alert Pain management: pain level controlled Vital Signs Assessment: post-procedure vital signs reviewed and stable Respiratory status: spontaneous breathing and respiratory function stable Cardiovascular status: blood pressure returned to baseline and stable Postop Assessment: no headache, no backache, no apparent nausea or vomiting and able to ambulate Anesthetic complications: no  No notable events documented.   Last Vitals:  Vitals:   12/30/23 2305 12/31/23 0253  BP: 109/69 106/63  Pulse: (!) 105 72  Resp: 18 18  Temp: 36.9 C 36.8 C  SpO2: 100% 100%    Last Pain:  Vitals:   12/31/23 0353  TempSrc:   PainSc: 2                  [name],  Leory Rands

## 2023-12-31 NOTE — Progress Notes (Signed)
 Progress Note - Cesarean Delivery  Alexis Elliott is a 33 y.o. [id number] now PP day 1 s/p C-Section, Low Transverse.   Subjective:  Patient reports no problems with eating, bowel movements, voiding, or their wound  Bottlefeeding  Pain controlled  Objective:  Vital signs in last 24 hours: Temp:  [97.7 F (36.5 C)-98.5 F (36.9 C)] 98.3 F (36.8 C) (06/16 0253) Pulse Rate:  [53-105] 72 (06/16 0253) Resp:  [9-29] 18 (06/16 0253) BP: (106-132)/(56-100) 106/63 (06/16 0253) SpO2:  [95 %-100 %] 100 % (06/16 0253) Weight:  [89.5 kg] 89.5 kg (06/15 0840)  Physical Exam:  General: alert, cooperative, and no distress Lochia: appropriate Uterine Fundus: firm Incision: dressing intact    Data Review Recent Labs    12/30/23 1053 12/31/23 0502  HGB 12.3 10.6*  HCT 37.8 32.5*    Assessment:  Principal Problem:   Indication for care in labor or delivery Active Problems:   Labor and delivery, indication for care   Status post Cesarean section. Doing well postoperatively.     Plan:       Continue current care.  OOB  May shower  Dressing change  Delice Felt, M.D. 12/31/2023 7:37 AM

## 2024-01-01 ENCOUNTER — Other Ambulatory Visit

## 2024-01-01 ENCOUNTER — Encounter

## 2024-01-01 DIAGNOSIS — O321XX0 Maternal care for breech presentation, not applicable or unspecified: Secondary | ICD-10-CM | POA: Diagnosis not present

## 2024-01-01 DIAGNOSIS — Z98891 History of uterine scar from previous surgery: Secondary | ICD-10-CM

## 2024-01-01 MED ORDER — IBUPROFEN 600 MG PO TABS: 600.0000 mg | ORAL_TABLET | Freq: Four times a day (QID) | ORAL | 1 refills | Status: AC

## 2024-01-01 MED ORDER — OXYCODONE HCL 5 MG PO TABS: 5.0000 mg | ORAL_TABLET | Freq: Four times a day (QID) | ORAL | 0 refills | Status: AC | PRN

## 2024-01-01 MED ORDER — ACETAMINOPHEN 500 MG PO TABS: 1000.0000 mg | ORAL_TABLET | Freq: Four times a day (QID) | ORAL | 0 refills | Status: DC | PRN

## 2024-01-01 MED ORDER — SIMETHICONE 80 MG PO CHEW
80.0000 mg | CHEWABLE_TABLET | Freq: Four times a day (QID) | ORAL | 0 refills | Status: AC
Start: 2024-01-01 — End: ?

## 2024-01-01 MED ORDER — DOCUSATE SODIUM 100 MG PO CAPS: 100.0000 mg | ORAL_CAPSULE | Freq: Two times a day (BID) | ORAL | 2 refills | Status: AC

## 2024-01-01 MED ORDER — POLYETHYLENE GLYCOL 3350 17 G PO PACK: 17.0000 g | PACK | Freq: Every day | ORAL | 0 refills | Status: AC | PRN

## 2024-01-01 MED ORDER — MEDROXYPROGESTERONE ACETATE 150 MG/ML IM SUSP
150.0000 mg | Freq: Once | INTRAMUSCULAR | Status: AC
  Administered 2024-01-01: 150 mg via INTRAMUSCULAR
  Filled 2024-01-01: qty 1

## 2024-01-01 MED ORDER — LIDOCAINE 5 % EX PTCH: 1.0000 | MEDICATED_PATCH | CUTANEOUS | 0 refills | Status: AC

## 2024-01-01 NOTE — Discharge Summary (Signed)
 Postpartum Discharge Summary      Patient Name: Alexis Elliott DOB: June 05, 1991 MRN: [id number]  Date of admission: 12/30/2023 Delivery date:12/30/2023 Delivering provider: Zenobia Hila Date of discharge: 01/01/2024  Admitting diagnosis: Indication for care in labor or delivery [O75.9] Labor and delivery, indication for care [O75.9] Intrauterine pregnancy: [redacted]w[redacted]d     Secondary diagnosis:  Principal Problem:   Indication for care in labor or delivery Active Problems:   Pre-existing hypertension affecting pregnancy, antepartum   Labor and delivery, indication for care   Breech presentation delivered   Status post primary low transverse cesarean section  Additional problems: cHTN, sickle cell trait carrier    Discharge diagnosis: Term Pregnancy Delivered and CHTN                                              Post partum procedures:none Augmentation: N/A Complications: None  Hospital course: Onset of Labor With Unplanned C/S   33 y.o. yo [id number] at [redacted]w[redacted]d was admitted in Latent Labor due to PROM on 12/30/2023. On admission breech presentation identified. The patient went for cesarean section due to Malpresentation. Delivery details as follows: Membrane Rupture Time/Date: 7:25 AM,12/30/2023  Delivery Method:C-Section, Low Transverse Operative Delivery:N/A Details of operation can be found in separate operative note. Patient had a postpartum course complicated by none.  She is ambulating,tolerating a regular diet, passing flatus, and urinating well.  Patient is discharged home in stable condition 01/01/24.  Newborn Data: Birth date:12/30/2023 Birth time:12:28 PM Gender:Female Living status:Living Apgars:8 ,9  Weight:2260 g  Magnesium Sulfate received: No BMZ received: No Rhophylac:N/A MMR:N/A T-DaP:Given prenatally Flu: N/A RSV Vaccine received: No Transfusion:No Immunizations administered: Immunization History  Administered Date(s) Administered   Moderna  Sars-Covid-2 Vaccination 12/03/2019, 02/17/2020   Tdap 10/25/2023    Physical exam  Vitals:   12/31/23 2000 01/01/24 0005 01/01/24 0258 01/01/24 0802  BP: 136/81 120/75 129/85 115/71  Pulse: 75 67 89 (!) 50  Resp: 20 20 18 18   Temp: 98.3 F (36.8 C) 98.2 F (36.8 C) 98.5 F (36.9 C) 98.7 F (37.1 C)  TempSrc: Oral  Oral Oral  SpO2: 99% 99% 98% 100%  Weight:      Height:       General: alert, cooperative, and no distress Lochia: appropriate Uterine Fundus: firm Incision: Dressing is clean, dry, and intact DVT Evaluation: No evidence of DVT seen on physical exam. Labs: Lab Results  Component Value Date   WBC 17.2 (H) 12/31/2023   HGB 10.6 (L) 12/31/2023   HCT 32.5 (L) 12/31/2023   MCV 80.6 12/31/2023   PLT 267 12/31/2023      Latest Ref Rng & Units 12/30/2023   10:53 AM  CMP  Glucose 70 - 99 mg/dL 73   BUN 6 - 20 mg/dL 8   Creatinine 1.47 - 8.29 mg/dL 5.62   Sodium 130 - 865 mmol/L 135   Potassium 3.5 - 5.1 mmol/L 3.8   Chloride 98 - 111 mmol/L 106   CO2 22 - 32 mmol/L 20   Calcium 8.9 - 10.3 mg/dL 8.9   Total Protein 6.5 - 8.1 g/dL 7.0   Total Bilirubin 0.0 - 1.2 mg/dL 0.7   Alkaline Phos 38 - 126 U/L 156   AST 15 - 41 U/L 26   ALT 0 - 44 U/L 27    Edinburgh  Score:    01/01/2024    9:26 AM  Edinburgh Postnatal Depression Scale Screening Tool  I have been able to laugh and see the funny side of things. 0  I have looked forward with enjoyment to things. 0  I have blamed myself unnecessarily when things went wrong. 1  I have been anxious or worried for no good reason. 2  I have felt scared or panicky for no good reason. 1  Things have been getting on top of me. 0  I have been so unhappy that I have had difficulty sleeping. 0  I have felt sad or miserable. 0  I have been so unhappy that I have been crying. 1  The thought of harming myself has occurred to me. 0  Edinburgh Postnatal Depression Scale Total 5      After visit meds:  Allergies as of  01/01/2024   No Known Allergies      Medication List     STOP taking these medications    LOW-DOSE ASPIRIN PO       TAKE these medications    acetaminophen  500 MG tablet Commonly known as: TYLENOL  Take 2 tablets (1,000 mg total) by mouth every 6 (six) hours as needed for moderate pain (pain score 4-6). What changed:  medication strength how much to take reasons to take this   Blood Pressure Kit Devi 1 kit by Does not apply route daily.   docusate sodium  100 MG capsule Commonly known as: Colace Take 1 capsule (100 mg total) by mouth 2 (two) times daily.   ibuprofen  600 MG tablet Commonly known as: ADVIL  Take 1 tablet (600 mg total) by mouth every 6 (six) hours.   lidocaine  5 % Commonly known as: Lidoderm  Place 1 patch onto the skin daily for 10 days. Remove & Discard patch within 12 hours or as directed by MD   NIFEdipine  30 MG 24 hr tablet Commonly known as: PROCARDIA -XL/NIFEDICAL-XL Take 1 tablet (30 mg total) by mouth daily.   oxyCODONE  5 MG immediate release tablet Commonly known as: Oxy IR/ROXICODONE  Take 1 tablet (5 mg total) by mouth every 6 (six) hours as needed for severe pain (pain score 7-10).   polyethylene glycol 17 g packet Commonly known as: MiraLax Take 17 g by mouth daily as needed for mild constipation.   prenatal vitamin w/FE, FA 29-1 MG Chew chewable tablet Chew 1 tablet by mouth daily at 12 noon.   simethicone 80 MG chewable tablet Commonly known as: MYLICON Chew 1 tablet (80 mg total) by mouth 4 (four) times daily.         Discharge home in stable condition Infant Feeding: Bottle Infant Disposition:home with mother Discharge instruction: per After Visit Summary and Postpartum booklet. Activity: Advance as tolerated. Pelvic rest for 6 weeks.  Diet: routine diet Anticipated Birth Control: PP Depo given Postpartum Appointment:6 weeks Additional Postpartum F/U: Incision check 1 week and BP check 1 week Future Appointments:No  future appointments. Follow up Visit:  Follow-up Information     [facility] at [facility]. Schedule an appointment as soon as possible for a visit in 1 week(s).   Specialty: Obstetrics and Gynecology Why: Incision & Blood pressure check Contact information: [address] [place]  [id number] [phone]        [facility] at [facility] Follow up in 6 week(s).   Specialty: Obstetrics and Gynecology Why: Postpartum visit Contact information: [address] [place]   [id number] [phone]  01/01/2024 [name], CNM

## 2024-01-01 NOTE — Discharge Instructions (Signed)

## 2024-01-01 NOTE — Plan of Care (Signed)
 Patient discharged home with family. Discharge instructions, when to follow up, and prescriptions reviewed with patient. Patient verbalized understanding. Patient will be escorted out by auxiliary.

## 2024-01-01 NOTE — Progress Notes (Signed)
 Patient discharged home with family.  Patient will be escorted out by auxiliary.   Erlene Hawks, RN 01/01/24 @1351 

## 2024-01-03 ENCOUNTER — Telehealth: Payer: Self-pay

## 2024-01-03 NOTE — Telephone Encounter (Signed)
 Received fax stating Lidoderm  patches are not covered by patient's insurance.  Called and spoke to patient - explained she can buy the 4% patches OTC instead.  She is on the way to the pharmacy now to pick up.  Also called and spoke to pharmacist who said they will direct her to the OTC patches when she arrives.

## 2024-01-09 ENCOUNTER — Telehealth: Payer: Self-pay | Admitting: Obstetrics and Gynecology

## 2024-01-09 ENCOUNTER — Ambulatory Visit: Admitting: Obstetrics and Gynecology

## 2024-01-09 DIAGNOSIS — Z9889 Other specified postprocedural states: Secondary | ICD-10-CM

## 2024-01-09 DIAGNOSIS — O10919 Unspecified pre-existing hypertension complicating pregnancy, unspecified trimester: Secondary | ICD-10-CM

## 2024-01-09 NOTE — Progress Notes (Unsigned)
   Postoperative Cesarean Follow-up Visit Alexis Elliott is a 33 y.o. [id number] s/p rLTCS  at [redacted]w[redacted]d for PROM/breech, POD#11, here today for incision check.  Subjective: The patient is not having any pain. She denies fever, chills, nausea, and vomiting. Eating a regular diet without difficulty.  Is having regular bowel movements. Activity: normal activities of daily living. Bleeding is light/brown. She denies issues with her incision.  EPDS = 3, improved from a week ago at 5, and in hospital 11. cHTN: is taking Procardia  and ASA daily, denies HA, vision changes, palpitations, dizziness/lightheadedness, or weakness.  Objective: BP 127/70   Pulse 61   Ht 5' 2 (1.575 m)   Wt 183 lb (83 kg)   Breastfeeding No   BMI 33.47 kg/m  Body mass index is 33.47 kg/m.  General:  alert and no distress  Abdomen: soft, bowel sounds active, non-tender  Incision:   healing well, no drainage, no erythema, no hernia, no seroma, no swelling, no dehiscence, incision well approximated    Assessment/Plan: Alexis Elliott is a 33 y.o. [phone] s/p pLTCS at [redacted]w[redacted]d for breech in setting of PROM, POD#11, here today for incision check, healing well. No concerns with incision today, remaining steri-strips removed and replaced.  -cHTN: BP 127/70 today, continue Procardia  XL 30mg  daily and ASA -Discussed at-home care, healing expectations, si/sx of infection.  -Call clinic if developing redness, discharge, or increasing pain. -Avoid vigorous scrubbing/washing of incision site; hygiene reviewed. -May trim back steri-strips if they peel; should fully remove after 1 week from today. -May resume driving and light walking. Still no heavy lifting >10-12 lbs and pelvic rest advised until cleared at 6wk postpartum visit.  -If stooling regularly x 1-2 weeks, can take stool softener every other day x 1 week, taper as tolerated. -RTC 5wks for postpartum visit with Dr. Janit, sooner prn.    Estil Mangle, DO [facility]

## 2024-01-09 NOTE — Telephone Encounter (Signed)
 Reached out to pt to reschedule 1 week incision check that was scheduled on 01/09/2024 at 3:15 with Dr. Janit.  Could not leave a message bc call could not be completed at the time.

## 2024-01-10 ENCOUNTER — Ambulatory Visit: Admitting: Obstetrics

## 2024-01-10 ENCOUNTER — Encounter: Payer: Self-pay | Admitting: Obstetrics

## 2024-01-10 DIAGNOSIS — O10919 Unspecified pre-existing hypertension complicating pregnancy, unspecified trimester: Secondary | ICD-10-CM

## 2024-01-10 NOTE — Telephone Encounter (Signed)
 Pt has been rescheduled to 01/10/2024 at 10:35 with Dr. Leigh.

## 2024-01-26 DIAGNOSIS — Z419 Encounter for procedure for purposes other than remedying health state, unspecified: Secondary | ICD-10-CM | POA: Diagnosis not present

## 2024-02-14 ENCOUNTER — Ambulatory Visit: Admitting: Obstetrics and Gynecology

## 2024-02-14 ENCOUNTER — Encounter: Payer: Self-pay | Admitting: Obstetrics and Gynecology

## 2024-02-14 DIAGNOSIS — O10919 Unspecified pre-existing hypertension complicating pregnancy, unspecified trimester: Secondary | ICD-10-CM

## 2024-02-14 DIAGNOSIS — Z9889 Other specified postprocedural states: Secondary | ICD-10-CM

## 2024-02-14 LAB — POCT URINALYSIS DIPSTICK OB
Bilirubin, UA: NEGATIVE
Glucose, UA: NEGATIVE
Ketones, UA: NEGATIVE
Leukocytes, UA: NEGATIVE
Nitrite, UA: NEGATIVE
POC,PROTEIN,UA: NEGATIVE
Spec Grav, UA: 1.02 (ref 1.010–1.025)
Urobilinogen, UA: 0.2 U/dL
pH, UA: 5 (ref 5.0–8.0)

## 2024-02-14 NOTE — Progress Notes (Signed)
 HPI:      [name] is a 33 y.o. [id number] who LMP was No LMP recorded.  Subjective:   She presents today 6 weeks postop and postpartum.  She reports she is generally doing well she takes daily Procardia .  She did forget to take it today.  She reports that she is about to go back to work and has some anxiety regarding this but she feels comfortable with the people who will be watching her daughter so this makes it a little bit easier. She reports her wound is doing well and has no problems eating going to the bathroom or normal activities. Patient was pulled over by the police today on the way to the her visit so she is a little bit anxious about that as well.    Hx: The following portions of the patient's history were reviewed and updated as appropriate:             She  has a past medical history of Ectopic pregnancy (10/2022), Hypertension, and Sickle cell trait (HCC). She does not have any pertinent problems on file. She  has a past surgical history that includes Finger surgery; Diagnostic laparoscopy with removal of ectopic pregnancy (Left, 11/14/2022); and Cesarean section (N/A, 12/30/2023). Her family history includes Diabetes in her maternal grandmother and paternal grandmother; Healthy in her half-sister, maternal grandfather, and sister; Hypertension in her maternal grandmother, mother, paternal grandfather, and paternal grandmother; Post-traumatic stress disorder in her sister; Sickle cell trait in her father. She  reports that she has never smoked. She has never used smokeless tobacco. She reports that she does not currently use alcohol. She reports that she does not currently use drugs after having used the following drugs: Marijuana. Frequency: 2.00 times per week. She has a current medication list which includes the following prescription(s): aspirin ec, blood pressure kit, docusate sodium , ibuprofen , nifedipine , polyethylene glycol, prenatal vitamin w/fe, fa, simethicone , and  oxycodone . She has no known allergies.       Review of Systems:  Review of Systems  Constitutional: Denied constitutional symptoms, night sweats, recent illness, fatigue, fever, insomnia and weight loss.  Eyes: Denied eye symptoms, eye pain, photophobia, vision change and visual disturbance.  Ears/Nose/Throat/Neck: Denied ear, nose, throat or neck symptoms, hearing loss, nasal discharge, sinus congestion and sore throat.  Cardiovascular: Denied cardiovascular symptoms, arrhythmia, chest pain/pressure, edema, exercise intolerance, orthopnea and palpitations.  Respiratory: Denied pulmonary symptoms, asthma, pleuritic pain, productive sputum, cough, dyspnea and wheezing.  Gastrointestinal: Denied, gastro-esophageal reflux, melena, nausea and vomiting.  Genitourinary: Denied genitourinary symptoms including symptomatic vaginal discharge, pelvic relaxation issues, and urinary complaints.  Musculoskeletal: Denied musculoskeletal symptoms, stiffness, swelling, muscle weakness and myalgia.  Dermatologic: Denied dermatology symptoms, rash and scar.  Neurologic: Denied neurology symptoms, dizziness, headache, neck pain and syncope.  Psychiatric: Denied psychiatric symptoms, anxiety and depression.  Endocrine: Denied endocrine symptoms including hot flashes and night sweats.   Meds:   Current Outpatient Medications on File Prior to Visit  Medication Sig Dispense Refill   aspirin EC 81 MG tablet Take 81 mg by mouth daily. Swallow whole.     Blood Pressure Monitoring (BLOOD PRESSURE KIT) DEVI 1 kit by Does not apply route daily. 1 each 0   docusate sodium  (COLACE) 100 MG capsule Take 1 capsule (100 mg total) by mouth 2 (two) times daily. 60 capsule 2   ibuprofen  (ADVIL ) 600 MG tablet Take 1 tablet (600 mg total) by mouth every 6 (six) hours. 60 tablet 1  NIFEdipine  (PROCARDIA -XL/NIFEDICAL-XL) 30 MG 24 hr tablet Take 1 tablet (30 mg total) by mouth daily. 90 tablet 3   polyethylene glycol (MIRALAX ) 17  g packet Take 17 g by mouth daily as needed for mild constipation. 30 each 0   prenatal vitamin w/FE, FA (NATACHEW) 29-1 MG CHEW chewable tablet Chew 1 tablet by mouth daily at 12 noon.     simethicone  (MYLICON) 80 MG chewable tablet Chew 1 tablet (80 mg total) by mouth 4 (four) times daily. 30 tablet 0   oxyCODONE  (OXY IR/ROXICODONE ) 5 MG immediate release tablet Take 1 tablet (5 mg total) by mouth every 6 (six) hours as needed for severe pain (pain score 7-10). (Patient not taking: Reported on 02/14/2024) 30 tablet 0   No current facility-administered medications on file prior to visit.      Objective:     Vitals:   02/14/24 0952 02/14/24 1030  BP: (!) 153/89 (!) 150/90  Pulse: (!) 47    Filed Weights   02/14/24 0952  Weight: 181 lb 3.2 oz (82.2 kg)               Abdomen: Soft.  Non-tender.  No masses.  No HSM.  Incision/s: Intact.  Healing well.  No erythema.  No drainage.             Assessment:    [id number] Patient Active Problem List   Diagnosis Date Noted   Breech presentation delivered 01/01/2024   Status post primary low transverse cesarean section 01/01/2024   Indication for care in labor or delivery 12/30/2023   Labor and delivery, indication for care 12/30/2023   Flank pain in pregnant patient 11/15/2023   Pre-existing hypertension affecting pregnancy, antepartum 08/03/2023   Blood glucose abnormal 07/12/2023   Obesity affecting pregnancy 07/04/2023   Sickle cell trait (HCC)    Supervision of high risk pregnancy, antepartum 06/13/2023     1. Postpartum care following cesarean delivery   2. Postoperative state   3. Chronic hypertension affecting pregnancy     Doing well postop and postpartum.  Blood pressure controlled with medication.   Plan:            1.  May resume normal activities with exception of heavy lifting  2.  Using Depo for birth control  3.  Continue Procardia   4.  Discussed separation anxiety and the desire of going back to work and  leaving her child.  If she has significant issues she has been asked to contact us  and we will further discuss this including possible use of medication.  Orders Orders Placed This Encounter  Procedures   POC Urinalysis Dipstick OB    No orders of the defined types were placed in this encounter.     F/U  Return in about 2 months (around 04/15/2024).  For Depo.  Alm DOROTHA Sar, M.D. 02/14/2024 10:33 AM

## 2024-02-14 NOTE — Progress Notes (Signed)
 Patient presents today for 6 week postpartum follow-up. Patient had a Cesarean delivery on 12/30/23. She states baby is bottle feeding. Continuing to take Procardia  30 mg daily for cHTN, however forgot to take it this morning. Depo administered in hospital for birth control. EPDS score of  8. She states no other questions or concerns at this time.

## 2024-02-26 DIAGNOSIS — Z419 Encounter for procedure for purposes other than remedying health state, unspecified: Secondary | ICD-10-CM | POA: Diagnosis not present

## 2024-03-28 DIAGNOSIS — Z419 Encounter for procedure for purposes other than remedying health state, unspecified: Secondary | ICD-10-CM | POA: Diagnosis not present

## 2024-04-15 ENCOUNTER — Ambulatory Visit

## 2024-04-15 NOTE — Progress Notes (Deleted)
    NURSE VISIT NOTE  Subjective:    Patient ID: Alexis Elliott, female    DOB: [birth year], 33 y.o.   MRN: [id number]  HPI  Patient is a 33 y.o. H3E7957 female who presents for depo provera  injection.   Objective:    There were no vitals taken for this visit.  Last Annual: ***. Last pap: ***. Last Depo-Provera : ***. Side Effects if any: {NONE:21772}***. Serum HCG indicated? {YES/NO:21197}. Depo-Provera  150 mg IM given by: {AOB Clinical Dujqq:71459}. Site: {AOB INJ [id number]  Lab Review  No results found for any visits on 04/15/24.  Assessment:   No diagnosis found.   Plan:   Next appointment due between 07/01/24 and 07/15/24.    Rollo JINNY Maxin, CMA

## 2024-04-15 NOTE — Patient Instructions (Incomplete)
